# Patient Record
Sex: Male | Born: 1975 | Race: Black or African American | Hispanic: No | Marital: Single | Smoking: Current every day smoker
Health system: Southern US, Community
[De-identification: ages and names within clinical notes are randomized; demographics above are authoritative.]

## PROBLEM LIST (undated history)

## (undated) ENCOUNTER — Ambulatory Visit (HOSPITAL_COMMUNITY): Admission: EM | Payer: Commercial Managed Care - HMO | Source: Home / Self Care

## (undated) DIAGNOSIS — S02609A Fracture of mandible, unspecified, initial encounter for closed fracture: Secondary | ICD-10-CM

## (undated) DIAGNOSIS — I10 Essential (primary) hypertension: Secondary | ICD-10-CM

## (undated) DIAGNOSIS — W3400XA Accidental discharge from unspecified firearms or gun, initial encounter: Secondary | ICD-10-CM

## (undated) HISTORY — PX: BACK SURGERY: SHX140

---

## 1999-02-11 ENCOUNTER — Emergency Department (HOSPITAL_COMMUNITY): Admission: EM | Admit: 1999-02-11 | Discharge: 1999-02-11 | Payer: Self-pay | Admitting: Emergency Medicine

## 2000-05-10 ENCOUNTER — Encounter: Payer: Self-pay | Admitting: Surgery

## 2000-05-10 ENCOUNTER — Inpatient Hospital Stay (HOSPITAL_COMMUNITY): Admission: EM | Admit: 2000-05-10 | Discharge: 2000-05-11 | Payer: Self-pay

## 2000-06-17 ENCOUNTER — Encounter: Payer: Self-pay | Admitting: Emergency Medicine

## 2000-06-17 ENCOUNTER — Emergency Department (HOSPITAL_COMMUNITY): Admission: EM | Admit: 2000-06-17 | Discharge: 2000-06-17 | Payer: Self-pay | Admitting: Emergency Medicine

## 2000-06-19 ENCOUNTER — Encounter (HOSPITAL_COMMUNITY): Admission: RE | Admit: 2000-06-19 | Discharge: 2000-09-17 | Payer: Self-pay | Admitting: Orthopedic Surgery

## 2009-03-03 ENCOUNTER — Emergency Department (HOSPITAL_COMMUNITY): Admission: EM | Admit: 2009-03-03 | Discharge: 2009-03-03 | Payer: Self-pay | Admitting: Family Medicine

## 2009-03-28 ENCOUNTER — Emergency Department (HOSPITAL_COMMUNITY): Admission: EM | Admit: 2009-03-28 | Discharge: 2009-03-28 | Payer: Self-pay | Admitting: Emergency Medicine

## 2009-06-02 ENCOUNTER — Emergency Department (HOSPITAL_COMMUNITY): Admission: EM | Admit: 2009-06-02 | Discharge: 2009-06-02 | Payer: Self-pay | Admitting: Emergency Medicine

## 2009-09-23 ENCOUNTER — Emergency Department (HOSPITAL_COMMUNITY): Admission: EM | Admit: 2009-09-23 | Discharge: 2009-09-23 | Payer: Self-pay | Admitting: Family Medicine

## 2011-03-09 LAB — GC/CHLAMYDIA PROBE AMP, GENITAL
Chlamydia, DNA Probe: NEGATIVE
GC Probe Amp, Genital: NEGATIVE

## 2011-03-09 LAB — POCT URINALYSIS DIP (DEVICE)
Glucose, UA: NEGATIVE mg/dL
Ketones, ur: 40 mg/dL — AB
Specific Gravity, Urine: >=1.03 (ref 1.005–1.030)

## 2011-03-09 LAB — RPR: RPR Ser Ql: NONREACTIVE

## 2011-03-12 LAB — URINALYSIS, ROUTINE W REFLEX MICROSCOPIC
Nitrite: NEGATIVE
Protein, ur: NEGATIVE mg/dL
Urobilinogen, UA: 0.2 mg/dL (ref 0.0–1.0)
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
## 2011-10-03 ENCOUNTER — Inpatient Hospital Stay (INDEPENDENT_AMBULATORY_CARE_PROVIDER_SITE_OTHER)
Admission: RE | Admit: 2011-10-03 | Discharge: 2011-10-03 | Disposition: A | Discharge status: Home / Self Care | Payer: Self-pay | Source: Ambulatory Visit | Attending: Family Medicine | Admitting: Family Medicine

## 2011-10-03 DIAGNOSIS — J40 Bronchitis, not specified as acute or chronic: Secondary | ICD-10-CM

## 2011-10-03 DIAGNOSIS — N342 Other urethritis: Secondary | ICD-10-CM

## 2011-10-04 LAB — GC/CHLAMYDIA PROBE AMP, GENITAL: GC Probe Amp, Genital: NEGATIVE

## 2014-02-10 ENCOUNTER — Encounter (HOSPITAL_COMMUNITY): Payer: Self-pay | Admitting: Emergency Medicine

## 2014-02-10 ENCOUNTER — Emergency Department (HOSPITAL_COMMUNITY)
Admission: EM | Admit: 2014-02-10 | Discharge: 2014-02-10 | Disposition: A | Discharge status: Home / Self Care | Payer: Self-pay | Attending: Emergency Medicine | Admitting: Emergency Medicine

## 2014-02-10 ENCOUNTER — Emergency Department (HOSPITAL_COMMUNITY): Payer: Self-pay

## 2014-02-10 DIAGNOSIS — F17200 Nicotine dependence, unspecified, uncomplicated: Secondary | ICD-10-CM | POA: Insufficient documentation

## 2014-02-10 DIAGNOSIS — R109 Unspecified abdominal pain: Secondary | ICD-10-CM

## 2014-02-10 DIAGNOSIS — Z87828 Personal history of other (healed) physical injury and trauma: Secondary | ICD-10-CM | POA: Insufficient documentation

## 2014-02-10 DIAGNOSIS — R1013 Epigastric pain: Secondary | ICD-10-CM | POA: Insufficient documentation

## 2014-02-10 DIAGNOSIS — R1011 Right upper quadrant pain: Secondary | ICD-10-CM | POA: Insufficient documentation

## 2014-02-10 HISTORY — DX: Accidental discharge from unspecified firearms or gun, initial encounter: W34.00XA

## 2014-02-10 LAB — COMPREHENSIVE METABOLIC PANEL
ALT: 43 U/L (ref 0–53)
AST: 24 U/L (ref 0–37)
Albumin: 4.2 g/dL (ref 3.5–5.2)
Alkaline Phosphatase: 108 U/L (ref 39–117)
BILIRUBIN TOTAL: 0.3 mg/dL (ref 0.3–1.2)
BUN: 10 mg/dL (ref 6–23)
CALCIUM: 9.6 mg/dL (ref 8.4–10.5)
CHLORIDE: 104 meq/L (ref 96–112)
CO2: 28 meq/L (ref 19–32)
CREATININE: 1.22 mg/dL (ref 0.50–1.35)
GFR, EST AFRICAN AMERICAN: 87 mL/min — AB (ref >90)
GFR, EST NON AFRICAN AMERICAN: 75 mL/min — AB (ref >90)
GLUCOSE: 115 mg/dL — AB (ref 70–99)
Potassium: 4.3 mEq/L (ref 3.7–5.3)
SODIUM: 144 meq/L (ref 137–147)
Total Protein: 7.6 g/dL (ref 6.0–8.3)

## 2014-02-10 LAB — CBC WITH DIFFERENTIAL/PLATELET
BASOS ABS: 0 10*3/uL (ref 0.0–0.1)
Basophils Relative: 0 % (ref 0–1)
EOS PCT: 1 % (ref 0–5)
Eosinophils Absolute: 0.1 10*3/uL (ref 0.0–0.7)
HEMATOCRIT: 46.8 % (ref 39.0–52.0)
Hemoglobin: 16 g/dL (ref 13.0–17.0)
LYMPHS ABS: 1.6 10*3/uL (ref 0.7–4.0)
LYMPHS PCT: 17 % (ref 12–46)
MCH: 30.1 pg (ref 26.0–34.0)
MCHC: 34.2 g/dL (ref 30.0–36.0)
MCV: 88 fL (ref 78.0–100.0)
MONO ABS: 0.4 10*3/uL (ref 0.1–1.0)
Monocytes Relative: 5 % (ref 3–12)
NEUTROS ABS: 7.1 10*3/uL (ref 1.7–7.7)
Neutrophils Relative %: 77 % (ref 43–77)
Platelets: 216 10*3/uL (ref 150–400)
RBC: 5.32 MIL/uL (ref 4.22–5.81)
RDW: 13 % (ref 11.5–15.5)
WBC: 9.2 10*3/uL (ref 4.0–10.5)

## 2014-02-10 LAB — URINALYSIS, ROUTINE W REFLEX MICROSCOPIC
Bilirubin Urine: NEGATIVE
GLUCOSE, UA: NEGATIVE mg/dL
HGB URINE DIPSTICK: NEGATIVE
KETONES UR: NEGATIVE mg/dL
Leukocytes, UA: NEGATIVE
Nitrite: NEGATIVE
PROTEIN: NEGATIVE mg/dL
Specific Gravity, Urine: 1.039 — ABNORMAL HIGH (ref 1.005–1.030)
UROBILINOGEN UA: 1 mg/dL (ref 0.0–1.0)
pH: 7 (ref 5.0–8.0)

## 2014-02-10 LAB — LIPASE, BLOOD: Lipase: 27 U/L (ref 11–59)

## 2014-02-10 MED ORDER — SODIUM CHLORIDE 0.9 % IV BOLUS (SEPSIS)
1000.0000 mL | Freq: Once | INTRAVENOUS | Status: AC
  Administered 2014-02-10: 1000 mL via INTRAVENOUS

## 2014-02-10 MED ORDER — OXYCODONE-ACETAMINOPHEN 5-325 MG PO TABS: 2.0000 | ORAL_TABLET | ORAL | Status: DC | PRN

## 2014-02-10 MED ORDER — IOHEXOL 300 MG/ML  SOLN
25.0000 mL | INTRAMUSCULAR | Status: AC
  Administered 2014-02-10: 25 mL via ORAL

## 2014-02-10 MED ORDER — IOHEXOL 300 MG/ML  SOLN: 25.0000 mL | Freq: Once | INTRAMUSCULAR | Status: DC | PRN

## 2014-02-10 MED ORDER — SODIUM CHLORIDE 0.9 % IV SOLN
INTRAVENOUS | Status: DC
  Administered 2014-02-10: 12:00:00 via INTRAVENOUS

## 2014-02-10 MED ORDER — ONDANSETRON HCL 4 MG/2ML IJ SOLN
4.0000 mg | Freq: Once | INTRAMUSCULAR | Status: AC
  Administered 2014-02-10: 4 mg via INTRAVENOUS
  Filled 2014-02-10: qty 2

## 2014-02-10 MED ORDER — IOHEXOL 300 MG/ML  SOLN
100.0000 mL | Freq: Once | INTRAMUSCULAR | Status: AC | PRN
  Administered 2014-02-10: 100 mL via INTRAVENOUS

## 2014-02-10 MED ORDER — HYDROMORPHONE HCL PF 1 MG/ML IJ SOLN
1.0000 mg | Freq: Once | INTRAMUSCULAR | Status: AC
  Administered 2014-02-10: 1 mg via INTRAVENOUS
  Filled 2014-02-10: qty 1

## 2014-02-10 NOTE — Discharge Instructions (Signed)
Abdominal Pain, Adult °Many things can cause abdominal pain. Usually, abdominal pain is not caused by a disease and will improve without treatment. It can often be observed and treated at home. Your health care provider will do a physical exam and possibly order blood tests and X-rays to help determine the seriousness of your pain. However, in many cases, more time must pass before a clear cause of the pain can be found. Before that point, your health care provider may not know if you need more testing or further treatment. °HOME CARE INSTRUCTIONS  °Monitor your abdominal pain for any changes. The following actions may help to alleviate any discomfort you are experiencing: °· Only take over-the-counter or prescription medicines as directed by your health care provider. °· Do not take laxatives unless directed to do so by your health care provider. °· Try a clear liquid diet (broth, tea, or water) as directed by your health care provider. Slowly move to a bland diet as tolerated. °SEEK MEDICAL CARE IF: °· You have unexplained abdominal pain. °· You have abdominal pain associated with nausea or diarrhea. °· You have pain when you urinate or have a bowel movement. °· You experience abdominal pain that wakes you in the night. °· You have abdominal pain that is worsened or improved by eating food. °· You have abdominal pain that is worsened with eating fatty foods. °SEEK IMMEDIATE MEDICAL CARE IF:  °· Your pain does not go away within 2 hours. °· You have a fever. °· You keep throwing up (vomiting). °· Your pain is felt only in portions of the abdomen, such as the right side or the left lower portion of the abdomen. °· You pass bloody or black tarry stools. °MAKE SURE YOU: °· Understand these instructions.   °· Will watch your condition.   °· Will get help right away if you are not doing well or get worse.   °Document Released: 08/27/2005 Document Revised: 09/07/2013 Document Reviewed: 07/27/2013 °ExitCare® Patient  Information ©2014 [employer]. ° °

## 2014-02-10 NOTE — ED Notes (Signed)
Patient transported to CT 

## 2014-02-10 NOTE — ED Notes (Signed)
Pt states he began to have upper abd pain radiating into his back that woke him from his sleep this morning. C/o nausea also. He is A&Ox4, resp e/u

## 2014-02-10 NOTE — Discharge Planning (Signed)
P4[facility] Felicia E, [employer]iaison  Spoke to patient about primary care resources and establishing care with a provider, resource guide given. My contact information was also provided for future questions or concerns.
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
## 2014-12-17 ENCOUNTER — Emergency Department (HOSPITAL_COMMUNITY)
Admission: EM | Admit: 2014-12-17 | Discharge: 2014-12-17 | Disposition: A | Discharge status: Home / Self Care | Payer: Self-pay | Attending: Emergency Medicine | Admitting: Emergency Medicine

## 2014-12-17 ENCOUNTER — Emergency Department (HOSPITAL_COMMUNITY): Payer: Self-pay

## 2014-12-17 ENCOUNTER — Encounter (HOSPITAL_COMMUNITY): Payer: Self-pay | Admitting: Radiology

## 2014-12-17 DIAGNOSIS — T1490XA Injury, unspecified, initial encounter: Secondary | ICD-10-CM

## 2014-12-17 DIAGNOSIS — R0602 Shortness of breath: Secondary | ICD-10-CM | POA: Insufficient documentation

## 2014-12-17 DIAGNOSIS — Y998 Other external cause status: Secondary | ICD-10-CM | POA: Insufficient documentation

## 2014-12-17 DIAGNOSIS — S20452A Superficial foreign body of left back wall of thorax, initial encounter: Secondary | ICD-10-CM | POA: Insufficient documentation

## 2014-12-17 DIAGNOSIS — R61 Generalized hyperhidrosis: Secondary | ICD-10-CM | POA: Insufficient documentation

## 2014-12-17 DIAGNOSIS — Y9389 Activity, other specified: Secondary | ICD-10-CM | POA: Insufficient documentation

## 2014-12-17 DIAGNOSIS — S4992XA Unspecified injury of left shoulder and upper arm, initial encounter: Secondary | ICD-10-CM | POA: Insufficient documentation

## 2014-12-17 DIAGNOSIS — W3400XA Accidental discharge from unspecified firearms or gun, initial encounter: Secondary | ICD-10-CM | POA: Insufficient documentation

## 2014-12-17 DIAGNOSIS — Y9289 Other specified places as the place of occurrence of the external cause: Secondary | ICD-10-CM | POA: Insufficient documentation

## 2014-12-17 LAB — TYPE AND SCREEN
ABO/RH(D): O POS
Antibody Screen: NEGATIVE
UNIT DIVISION: 0
Unit division: 0

## 2014-12-17 LAB — COMPREHENSIVE METABOLIC PANEL
ALT: 30 U/L (ref 0–53)
AST: 27 U/L (ref 0–37)
Albumin: 4.2 g/dL (ref 3.5–5.2)
Alkaline Phosphatase: 101 U/L (ref 39–117)
Anion gap: 15 (ref 5–15)
BUN: 12 mg/dL (ref 6–23)
CHLORIDE: 101 meq/L (ref 96–112)
CO2: 19 mmol/L (ref 19–32)
CREATININE: 1.22 mg/dL (ref 0.50–1.35)
Calcium: 9.4 mg/dL (ref 8.4–10.5)
GFR calc Af Amer: 86 mL/min — ABNORMAL LOW (ref >90)
GFR calc non Af Amer: 74 mL/min — ABNORMAL LOW (ref >90)
Glucose, Bld: 117 mg/dL — ABNORMAL HIGH (ref 70–99)
Potassium: 3.6 mmol/L (ref 3.5–5.1)
Sodium: 135 mmol/L (ref 135–145)
Total Bilirubin: 0.4 mg/dL (ref 0.3–1.2)
Total Protein: 7.4 g/dL (ref 6.0–8.3)

## 2014-12-17 LAB — CBC
HCT: 46.3 % (ref 39.0–52.0)
Hemoglobin: 16.1 g/dL (ref 13.0–17.0)
MCH: 30 pg (ref 26.0–34.0)
MCHC: 34.8 g/dL (ref 30.0–36.0)
MCV: 86.4 fL (ref 78.0–100.0)
PLATELETS: 265 10*3/uL (ref 150–400)
RBC: 5.36 MIL/uL (ref 4.22–5.81)
RDW: 12.8 % (ref 11.5–15.5)
WBC: 8.9 10*3/uL (ref 4.0–10.5)

## 2014-12-17 LAB — PREPARE FRESH FROZEN PLASMA
UNIT DIVISION: 0
Unit division: 0

## 2014-12-17 LAB — ETHANOL: Alcohol, Ethyl (B): 21 mg/dL — ABNORMAL HIGH (ref 0–9)

## 2014-12-17 LAB — ABO/RH: ABO/RH(D): O POS

## 2014-12-17 LAB — PROTIME-INR
INR: 0.94 (ref 0.00–1.49)
Prothrombin Time: 12.6 seconds (ref 11.6–15.2)

## 2014-12-17 MED ORDER — HYDROMORPHONE HCL 1 MG/ML IJ SOLN
1.0000 mg | Freq: Once | INTRAMUSCULAR | Status: AC
  Administered 2014-12-17: 1 mg via INTRAVENOUS

## 2014-12-17 MED ORDER — LIDOCAINE-EPINEPHRINE (PF) 2 %-1:200000 IJ SOLN
10.0000 mL | Freq: Once | INTRAMUSCULAR | Status: AC
  Administered 2014-12-17: 10 mL via INTRADERMAL
  Filled 2014-12-17: qty 20

## 2014-12-17 MED ORDER — TETANUS-DIPHTH-ACELL PERTUSSIS 5-2.5-18.5 LF-MCG/0.5 IM SUSP
0.5000 mL | Freq: Once | INTRAMUSCULAR | Status: AC
  Administered 2014-12-17: 0.5 mL via INTRAMUSCULAR
  Filled 2014-12-17: qty 0.5

## 2014-12-17 MED ORDER — HYDROMORPHONE HCL 1 MG/ML IJ SOLN
INTRAMUSCULAR | Status: AC
  Filled 2014-12-17: qty 1

## 2014-12-17 MED ORDER — HYDROCODONE-ACETAMINOPHEN 5-325 MG PO TABS: 2.0000 | ORAL_TABLET | ORAL | Status: DC | PRN

## 2014-12-17 MED ORDER — CEFAZOLIN SODIUM-DEXTROSE 2-3 GM-% IV SOLR
2.0000 g | Freq: Once | INTRAVENOUS | Status: AC
  Administered 2014-12-17: 2 g via INTRAVENOUS
  Filled 2014-12-17: qty 50

## 2014-12-17 MED ORDER — IOHEXOL 300 MG/ML  SOLN
80.0000 mL | Freq: Once | INTRAMUSCULAR | Status: AC | PRN
  Administered 2014-12-17: 80 mL via INTRAVENOUS

## 2014-12-17 NOTE — ED Notes (Signed)
Patient to CT with RN
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
## 2014-12-17 NOTE — ED Notes (Signed)
Patient returned from CT
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
## 2014-12-17 NOTE — ED Notes (Signed)
C-Collar removed by MD
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
## 2014-12-18 LAB — CDS SEROLOGY

## 2014-12-27 ENCOUNTER — Emergency Department (HOSPITAL_COMMUNITY)
Admission: EM | Admit: 2014-12-27 | Discharge: 2014-12-27 | Disposition: A | Discharge status: Home / Self Care | Payer: Self-pay | Attending: Emergency Medicine | Admitting: Emergency Medicine

## 2014-12-27 ENCOUNTER — Encounter (HOSPITAL_COMMUNITY): Payer: Self-pay | Admitting: *Deleted

## 2014-12-27 DIAGNOSIS — T798XXA Other early complications of trauma, initial encounter: Secondary | ICD-10-CM

## 2014-12-27 DIAGNOSIS — Z720 Tobacco use: Secondary | ICD-10-CM | POA: Insufficient documentation

## 2014-12-27 DIAGNOSIS — Z4802 Encounter for removal of sutures: Secondary | ICD-10-CM

## 2014-12-27 DIAGNOSIS — L089 Local infection of the skin and subcutaneous tissue, unspecified: Secondary | ICD-10-CM | POA: Insufficient documentation

## 2014-12-27 MED ORDER — CEPHALEXIN 500 MG PO CAPS: 500.0000 mg | ORAL_CAPSULE | Freq: Four times a day (QID) | ORAL | Status: DC

## 2014-12-27 MED ORDER — HYDROCODONE-ACETAMINOPHEN 5-325 MG PO TABS: 1.0000 | ORAL_TABLET | Freq: Four times a day (QID) | ORAL | Status: DC | PRN
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
## 2015-01-17 ENCOUNTER — Encounter (HOSPITAL_COMMUNITY): Payer: Self-pay | Admitting: Emergency Medicine

## 2016-04-20 ENCOUNTER — Encounter (HOSPITAL_COMMUNITY): Payer: Self-pay | Admitting: Emergency Medicine

## 2016-04-20 ENCOUNTER — Emergency Department (HOSPITAL_COMMUNITY)
Admission: EM | Admit: 2016-04-20 | Discharge: 2016-04-20 | Disposition: A | Discharge status: Home / Self Care | Payer: Self-pay | Attending: Emergency Medicine | Admitting: Emergency Medicine

## 2016-04-20 DIAGNOSIS — Z792 Long term (current) use of antibiotics: Secondary | ICD-10-CM | POA: Insufficient documentation

## 2016-04-20 DIAGNOSIS — F17210 Nicotine dependence, cigarettes, uncomplicated: Secondary | ICD-10-CM | POA: Insufficient documentation

## 2016-04-20 DIAGNOSIS — Z87828 Personal history of other (healed) physical injury and trauma: Secondary | ICD-10-CM | POA: Insufficient documentation

## 2016-04-20 DIAGNOSIS — J029 Acute pharyngitis, unspecified: Secondary | ICD-10-CM | POA: Insufficient documentation

## 2016-04-20 LAB — RAPID STREP SCREEN (MED CTR MEBANE ONLY): STREPTOCOCCUS, GROUP A SCREEN (DIRECT): NEGATIVE
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
## 2016-04-20 NOTE — Discharge Instructions (Signed)
Read the information below.    We will send the throat swab for culture, if positive we will call you.  Be sure to establish a primary care provider. [facility].  You can try symptomatic relief with tylenol or ibuprofen for pain, gargle with warm salt water, consumer warm liquids such as chicken broth or tea, and try throat lozenges You may return to the Emergency Department at any time for worsening condition or any new symptoms that concern you. Return to the ED if you develop fever or difficulty swallowing, difficulty breathing, drooling, or unilateral swelling.   Results for orders placed or performed during the hospital encounter of 04/20/16  Rapid strep screen  Result Value Ref Range   Streptococcus, Group A Screen (Direct) NEGATIVE NEGATIVE   No results found.    Pharyngitis Pharyngitis is a sore throat (pharynx). There is redness, pain, and swelling of your throat. HOME CARE   Drink enough fluids to keep your pee (urine) clear or pale yellow.  Only take medicine as told by your doctor.  You may get sick again if you do not take medicine as told. Finish your medicines, even if you start to feel better.  Do not take aspirin.  Rest.  Rinse your mouth (gargle) with salt water ( tsp of salt per 1 qt of water) every 1-2 hours. This will help the pain.  If you are not at risk for choking, you can suck on hard candy or sore throat lozenges. GET HELP IF:  You have large, tender lumps on your neck.  You have a rash.  You cough up green, yellow-brown, or bloody spit. GET HELP RIGHT AWAY IF:   You have a stiff neck.  You drool or cannot swallow liquids.  You throw up (vomit) or are not able to keep medicine or liquids down.  You have very bad pain that does not go away with medicine.  You have problems breathing (not from a stuffy nose). MAKE SURE YOU:   Understand these instructions.  Will watch your  condition.  Will get help right away if you are not doing well or get worse.   This information is not intended to replace advice given to you by your health care provider. Make sure you discuss any questions you have with your health care provider.   Document Released: 05/05/2008 Document Revised: 09/07/2013 Document Reviewed: 07/25/2013 Elsevier Interactive Patient Education [employer]2016 [employer]vier Inc.

## 2016-04-20 NOTE — ED Notes (Signed)
Patient c/o sore throat beginning this morning. Reports painful to swallow.

## 2016-04-23 LAB — CULTURE, GROUP A STREP (THRC)

## 2016-05-08 ENCOUNTER — Ambulatory Visit: Payer: Self-pay | Admitting: Family Medicine

## 2016-12-16 ENCOUNTER — Encounter (HOSPITAL_COMMUNITY): Payer: Self-pay

## 2016-12-16 ENCOUNTER — Emergency Department (HOSPITAL_COMMUNITY)
Admission: EM | Admit: 2016-12-16 | Discharge: 2016-12-16 | Disposition: A | Discharge status: Home / Self Care | Payer: Self-pay | Attending: Emergency Medicine | Admitting: Emergency Medicine

## 2016-12-16 ENCOUNTER — Emergency Department (HOSPITAL_COMMUNITY): Payer: Self-pay

## 2016-12-16 DIAGNOSIS — Z5321 Procedure and treatment not carried out due to patient leaving prior to being seen by health care provider: Secondary | ICD-10-CM | POA: Insufficient documentation

## 2016-12-16 DIAGNOSIS — Z4801 Encounter for change or removal of surgical wound dressing: Secondary | ICD-10-CM | POA: Insufficient documentation

## 2016-12-16 DIAGNOSIS — R200 Anesthesia of skin: Secondary | ICD-10-CM | POA: Insufficient documentation
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
## 2016-12-16 NOTE — ED Notes (Signed)
Per staff, Pt decided to leave.

## 2017-01-23 ENCOUNTER — Encounter (HOSPITAL_COMMUNITY): Payer: Self-pay | Admitting: Emergency Medicine

## 2017-01-23 ENCOUNTER — Ambulatory Visit (HOSPITAL_COMMUNITY)
Admission: EM | Admit: 2017-01-23 | Discharge: 2017-01-23 | Disposition: A | Discharge status: Home / Self Care | Payer: Self-pay | Attending: Family Medicine | Admitting: Family Medicine

## 2017-01-23 DIAGNOSIS — B309 Viral conjunctivitis, unspecified: Secondary | ICD-10-CM

## 2017-01-23 MED ORDER — POLYMYXIN B-TRIMETHOPRIM 10000-0.1 UNIT/ML-% OP SOLN: 1.0000 [drp] | OPHTHALMIC | 0 refills | Status: DC

## 2017-01-23 NOTE — ED Triage Notes (Signed)
Pt started having irritation in his right eye yesterday.  This morning he woke up with swelling around the eye.  Pt's girlfriend was diagnosed with pink eye today.
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
## 2017-01-23 NOTE — Discharge Instructions (Signed)
Warm cloth before eye medicine, return if needed.

## 2017-04-16 ENCOUNTER — Emergency Department (HOSPITAL_COMMUNITY): Payer: Self-pay

## 2017-04-16 ENCOUNTER — Emergency Department (HOSPITAL_COMMUNITY)
Admission: EM | Admit: 2017-04-16 | Discharge: 2017-04-16 | Disposition: A | Discharge status: Home / Self Care | Payer: Self-pay | Attending: Emergency Medicine | Admitting: Emergency Medicine

## 2017-04-16 ENCOUNTER — Encounter (HOSPITAL_COMMUNITY): Payer: Self-pay | Admitting: Emergency Medicine

## 2017-04-16 DIAGNOSIS — R112 Nausea with vomiting, unspecified: Secondary | ICD-10-CM | POA: Insufficient documentation

## 2017-04-16 DIAGNOSIS — Z79899 Other long term (current) drug therapy: Secondary | ICD-10-CM | POA: Insufficient documentation

## 2017-04-16 DIAGNOSIS — F17210 Nicotine dependence, cigarettes, uncomplicated: Secondary | ICD-10-CM | POA: Insufficient documentation

## 2017-04-16 DIAGNOSIS — R109 Unspecified abdominal pain: Secondary | ICD-10-CM | POA: Insufficient documentation

## 2017-04-16 DIAGNOSIS — R197 Diarrhea, unspecified: Secondary | ICD-10-CM | POA: Insufficient documentation

## 2017-04-16 LAB — URINALYSIS, ROUTINE W REFLEX MICROSCOPIC
BILIRUBIN URINE: NEGATIVE
Bacteria, UA: NONE SEEN
GLUCOSE, UA: NEGATIVE mg/dL
HGB URINE DIPSTICK: NEGATIVE
Ketones, ur: NEGATIVE mg/dL
Leukocytes, UA: NEGATIVE
NITRITE: NEGATIVE
PH: 5 (ref 5.0–8.0)
Protein, ur: 30 mg/dL — AB
Specific Gravity, Urine: 1.026 (ref 1.005–1.030)

## 2017-04-16 LAB — COMPREHENSIVE METABOLIC PANEL
ALT: 43 U/L (ref 17–63)
AST: 48 U/L — ABNORMAL HIGH (ref 15–41)
Albumin: 4.6 g/dL (ref 3.5–5.0)
Alkaline Phosphatase: 83 U/L (ref 38–126)
Anion gap: 10 (ref 5–15)
BILIRUBIN TOTAL: 0.6 mg/dL (ref 0.3–1.2)
BUN: 15 mg/dL (ref 6–20)
CALCIUM: 9.2 mg/dL (ref 8.9–10.3)
CHLORIDE: 106 mmol/L (ref 101–111)
CO2: 22 mmol/L (ref 22–32)
CREATININE: 1.1 mg/dL (ref 0.61–1.24)
Glucose, Bld: 135 mg/dL — ABNORMAL HIGH (ref 65–99)
Potassium: 3.9 mmol/L (ref 3.5–5.1)
Sodium: 138 mmol/L (ref 135–145)
TOTAL PROTEIN: 8.2 g/dL — AB (ref 6.5–8.1)

## 2017-04-16 LAB — CBC
HCT: 48 % (ref 39.0–52.0)
Hemoglobin: 16.7 g/dL (ref 13.0–17.0)
MCH: 31.2 pg (ref 26.0–34.0)
MCHC: 34.8 g/dL (ref 30.0–36.0)
MCV: 89.6 fL (ref 78.0–100.0)
PLATELETS: 232 10*3/uL (ref 150–400)
RBC: 5.36 MIL/uL (ref 4.22–5.81)
RDW: 12 % (ref 11.5–15.5)
WBC: 15.4 10*3/uL — AB (ref 4.0–10.5)

## 2017-04-16 LAB — POC OCCULT BLOOD, ED: FECAL OCCULT BLD: POSITIVE — AB

## 2017-04-16 LAB — LIPASE, BLOOD: LIPASE: 23 U/L (ref 11–51)

## 2017-04-16 MED ORDER — IOPAMIDOL (ISOVUE-300) INJECTION 61%
100.0000 mL | Freq: Once | INTRAVENOUS | Status: AC | PRN
  Administered 2017-04-16: 100 mL via INTRAVENOUS

## 2017-04-16 MED ORDER — DICYCLOMINE HCL 10 MG PO CAPS
10.0000 mg | ORAL_CAPSULE | Freq: Once | ORAL | Status: AC
  Administered 2017-04-16: 10 mg via ORAL
  Filled 2017-04-16: qty 1

## 2017-04-16 MED ORDER — HALOPERIDOL LACTATE 5 MG/ML IJ SOLN
2.0000 mg | Freq: Once | INTRAMUSCULAR | Status: AC
  Administered 2017-04-16: 2 mg via INTRAVENOUS
  Filled 2017-04-16: qty 1

## 2017-04-16 MED ORDER — PROMETHAZINE HCL 25 MG PO TABS: 25.0000 mg | ORAL_TABLET | Freq: Four times a day (QID) | ORAL | 0 refills | Status: DC | PRN

## 2017-04-16 MED ORDER — MORPHINE SULFATE (PF) 4 MG/ML IV SOLN
4.0000 mg | Freq: Once | INTRAVENOUS | Status: AC
  Administered 2017-04-16: 4 mg via INTRAVENOUS
  Filled 2017-04-16: qty 1

## 2017-04-16 MED ORDER — DICYCLOMINE HCL 20 MG PO TABS: 20.0000 mg | ORAL_TABLET | Freq: Two times a day (BID) | ORAL | 0 refills | Status: DC

## 2017-04-16 MED ORDER — IOPAMIDOL (ISOVUE-300) INJECTION 61%
INTRAVENOUS | Status: AC
  Filled 2017-04-16: qty 100

## 2017-04-16 MED ORDER — PROMETHAZINE HCL 25 MG/ML IJ SOLN
25.0000 mg | Freq: Once | INTRAMUSCULAR | Status: AC
  Administered 2017-04-16: 25 mg via INTRAVENOUS
  Filled 2017-04-16: qty 1

## 2017-04-16 MED ORDER — SODIUM CHLORIDE 0.9 % IV BOLUS (SEPSIS)
1000.0000 mL | Freq: Once | INTRAVENOUS | Status: AC
  Administered 2017-04-16: 1000 mL via INTRAVENOUS

## 2017-04-16 MED ORDER — METOCLOPRAMIDE HCL 5 MG/ML IJ SOLN
10.0000 mg | Freq: Once | INTRAMUSCULAR | Status: AC
  Administered 2017-04-16: 10 mg via INTRAVENOUS
  Filled 2017-04-16: qty 2

## 2017-04-16 MED ORDER — HYDROCHLOROTHIAZIDE 12.5 MG PO TABS: 25.0000 mg | ORAL_TABLET | Freq: Every day | ORAL | 0 refills | Status: DC
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
## 2017-04-16 NOTE — ED Notes (Signed)
Patient is A & O x4.  He understood AVS instructions.  

## 2017-04-16 NOTE — Discharge Instructions (Signed)
Please stay hydrated by drinking plenty of fluid.  Take Phenergan as needed for nausea.  Take HCTZ for your blood pressure.  Find a primary care provider for further management of your condition.  Avoid marijuana use as it may cause your symptoms.  Try hot shower which may help with your discomfort. Return to the ER if you have any concerns.

## 2017-04-16 NOTE — ED Triage Notes (Signed)
Pt states he has abd pain and chest pain with nausea, vomiting, and diarrhea that started about 5-6 hours ago  Pt states he has chills and cannot stop shaking

## 2017-10-30 ENCOUNTER — Emergency Department (HOSPITAL_COMMUNITY): Payer: Self-pay

## 2017-10-30 ENCOUNTER — Encounter (HOSPITAL_COMMUNITY): Payer: Self-pay | Admitting: Emergency Medicine

## 2017-10-30 ENCOUNTER — Emergency Department (HOSPITAL_COMMUNITY)
Admission: EM | Admit: 2017-10-30 | Discharge: 2017-10-30 | Disposition: A | Discharge status: Home / Self Care | Payer: Self-pay | Attending: Emergency Medicine | Admitting: Emergency Medicine

## 2017-10-30 DIAGNOSIS — Z5321 Procedure and treatment not carried out due to patient leaving prior to being seen by health care provider: Secondary | ICD-10-CM | POA: Insufficient documentation

## 2017-10-30 DIAGNOSIS — R200 Anesthesia of skin: Secondary | ICD-10-CM | POA: Insufficient documentation

## 2017-10-30 LAB — CBC
HCT: 45.1 % (ref 39.0–52.0)
Hemoglobin: 15.4 g/dL (ref 13.0–17.0)
MCH: 31.2 pg (ref 26.0–34.0)
MCHC: 34.1 g/dL (ref 30.0–36.0)
MCV: 91.3 fL (ref 78.0–100.0)
PLATELETS: 303 10*3/uL (ref 150–400)
RBC: 4.94 MIL/uL (ref 4.22–5.81)
RDW: 12.1 % (ref 11.5–15.5)
WBC: 11.6 10*3/uL — ABNORMAL HIGH (ref 4.0–10.5)

## 2017-10-30 LAB — BASIC METABOLIC PANEL
Anion gap: 6 (ref 5–15)
BUN: 13 mg/dL (ref 6–20)
CALCIUM: 9.6 mg/dL (ref 8.9–10.3)
CO2: 27 mmol/L (ref 22–32)
CREATININE: 1.2 mg/dL (ref 0.61–1.24)
Chloride: 103 mmol/L (ref 101–111)
GFR calc non Af Amer: >60 mL/min (ref >60)
GLUCOSE: 98 mg/dL (ref 65–99)
Potassium: 4.1 mmol/L (ref 3.5–5.1)
Sodium: 136 mmol/L (ref 135–145)

## 2017-10-30 LAB — I-STAT TROPONIN, ED: TROPONIN I, POC: 0.01 ng/mL (ref 0.00–0.08)

## 2017-10-30 NOTE — ED Triage Notes (Addendum)
Reports left arm weakness that started a little after 6pm.  No deficits noted.  Denies having any headache or visual issues.  Reporting some chest pain on left side.  Describes as pressure and burning.

## 2017-10-30 NOTE — ED Notes (Signed)
Dr. Jacqulyn BathLong notified of symptoms.  No other orders received at this time.

## 2017-10-30 NOTE — ED Notes (Signed)
Pt states he is not staying to be seen due to wait.  Encouraged pt to stay and he declines.

## 2017-10-30 NOTE — ED Notes (Signed)
Pt's mother upset that pt is leaving.  She is trying to talk him into staying.

## 2017-10-30 NOTE — ED Notes (Addendum)
Pt arrived to triage requesting BP to be taken.  Pt reports L arm numbness that started "about 2 hours ago."  Asked pt what time symptoms started and he continues to say about 2 hours.  Denies injury or pain.  No arm drift noted.  Normal sensation to both arms.  No other neuro deficits noted on triage exam.  Pt taken to triage.

## 2017-10-30 NOTE — ED Notes (Signed)
Pt remains in waiting room. Updated on wait for treatment room. 

## 2017-10-30 NOTE — ED Notes (Signed)
Pt told his mother that he feels better and he will return if he starts to feel bad again.  Explained that pt should stay and they declined.

## 2018-05-11 ENCOUNTER — Other Ambulatory Visit: Payer: Self-pay

## 2018-05-11 ENCOUNTER — Emergency Department (HOSPITAL_COMMUNITY): Admission: EM | Admit: 2018-05-11 | Payer: Self-pay | Source: Home / Self Care

## 2018-05-12 ENCOUNTER — Ambulatory Visit (INDEPENDENT_AMBULATORY_CARE_PROVIDER_SITE_OTHER): Payer: Self-pay

## 2018-05-12 ENCOUNTER — Encounter (HOSPITAL_COMMUNITY): Payer: Self-pay | Admitting: Emergency Medicine

## 2018-05-12 ENCOUNTER — Ambulatory Visit (HOSPITAL_COMMUNITY)
Admission: EM | Admit: 2018-05-12 | Discharge: 2018-05-12 | Disposition: A | Discharge status: Home / Self Care | Payer: Self-pay | Attending: Family Medicine | Admitting: Family Medicine

## 2018-05-12 ENCOUNTER — Other Ambulatory Visit: Payer: Self-pay

## 2018-05-12 DIAGNOSIS — S62326A Displaced fracture of shaft of fifth metacarpal bone, right hand, initial encounter for closed fracture: Secondary | ICD-10-CM

## 2018-05-12 DIAGNOSIS — S62339A Displaced fracture of neck of unspecified metacarpal bone, initial encounter for closed fracture: Secondary | ICD-10-CM

## 2018-05-12 DIAGNOSIS — W228XXA Striking against or struck by other objects, initial encounter: Secondary | ICD-10-CM

## 2018-05-12 MED ORDER — HYDROCODONE-ACETAMINOPHEN 5-325 MG PO TABS: 1.0000 | ORAL_TABLET | Freq: Four times a day (QID) | ORAL | 0 refills | Status: AC | PRN

## 2018-05-12 MED ORDER — IBUPROFEN 800 MG PO TABS: 800.0000 mg | ORAL_TABLET | Freq: Three times a day (TID) | ORAL | 0 refills | Status: DC

## 2018-05-12 NOTE — ED Notes (Signed)
Ortho Tech paged.

## 2018-05-12 NOTE — Progress Notes (Signed)
Orthopedic Tech Progress Note Patient Details:  Raymond Ford May 09, 1976 [id number]  Ortho Devices Type of Ortho Device: Ace wrap, Ulna gutter splint Ortho Device/Splint Location: rue Ortho Device/Splint Interventions: Application   Post Interventions Patient Tolerated: Well Instructions Provided: Care of device   Raymond Ford, Raymond Ford 05/12/2018, 1:35 PM

## 2018-05-12 NOTE — Discharge Instructions (Signed)
Use anti-inflammatories for pain/swelling. You may take up to 800 mg Ibuprofen every 8 hours with food. You may supplement Ibuprofen with Tylenol [phone] mg every 8 hours.   You may use hydrocodone for severe pain or pain at bedtime.  Please do not take during the day or when you will be driving.  Please follow-up with orthopedics/hand for further evaluation and treatment.

## 2018-05-12 NOTE — ED Notes (Signed)
Ortho Tech returned call.  He is with another pt and will be here ASAP.
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
## 2018-05-12 NOTE — ED Triage Notes (Signed)
Patient hit wall 2 days ago.  Right hand painful and swollen.  Minimal movement of fingers. capillary refill 2 +

## 2019-03-25 ENCOUNTER — Observation Stay (HOSPITAL_COMMUNITY)
Admission: EM | Admit: 2019-03-25 | Discharge: 2019-03-26 | Disposition: A | Discharge status: Home / Self Care | Payer: Self-pay | Attending: Otolaryngology | Admitting: Otolaryngology

## 2019-03-25 DIAGNOSIS — Z23 Encounter for immunization: Secondary | ICD-10-CM | POA: Insufficient documentation

## 2019-03-25 DIAGNOSIS — I10 Essential (primary) hypertension: Secondary | ICD-10-CM | POA: Insufficient documentation

## 2019-03-25 DIAGNOSIS — F10129 Alcohol abuse with intoxication, unspecified: Secondary | ICD-10-CM | POA: Insufficient documentation

## 2019-03-25 DIAGNOSIS — W19XXXA Unspecified fall, initial encounter: Secondary | ICD-10-CM | POA: Insufficient documentation

## 2019-03-25 DIAGNOSIS — M264 Malocclusion, unspecified: Secondary | ICD-10-CM | POA: Insufficient documentation

## 2019-03-25 DIAGNOSIS — S02609A Fracture of mandible, unspecified, initial encounter for closed fracture: Secondary | ICD-10-CM | POA: Diagnosis present

## 2019-03-25 DIAGNOSIS — Z791 Long term (current) use of non-steroidal anti-inflammatories (NSAID): Secondary | ICD-10-CM | POA: Insufficient documentation

## 2019-03-25 DIAGNOSIS — S0266XB Fracture of symphysis of mandible, initial encounter for open fracture: Principal | ICD-10-CM

## 2019-03-25 DIAGNOSIS — F17210 Nicotine dependence, cigarettes, uncomplicated: Secondary | ICD-10-CM | POA: Insufficient documentation

## 2019-03-25 HISTORY — DX: Essential (primary) hypertension: I10

## 2019-03-26 ENCOUNTER — Observation Stay (HOSPITAL_COMMUNITY): Payer: Self-pay | Admitting: Certified Registered"

## 2019-03-26 ENCOUNTER — Other Ambulatory Visit: Payer: Self-pay

## 2019-03-26 ENCOUNTER — Emergency Department (HOSPITAL_COMMUNITY): Payer: Self-pay

## 2019-03-26 ENCOUNTER — Encounter (HOSPITAL_COMMUNITY)
Admission: EM | Disposition: A | Discharge status: Home / Self Care | Payer: Self-pay | Source: Home / Self Care | Attending: Emergency Medicine

## 2019-03-26 ENCOUNTER — Encounter (HOSPITAL_COMMUNITY): Payer: Self-pay

## 2019-03-26 DIAGNOSIS — S02609A Fracture of mandible, unspecified, initial encounter for closed fracture: Secondary | ICD-10-CM | POA: Diagnosis present

## 2019-03-26 DIAGNOSIS — S0266XB Fracture of symphysis of mandible, initial encounter for open fracture: Secondary | ICD-10-CM

## 2019-03-26 HISTORY — DX: Fracture of symphysis of mandible, initial encounter for open fracture: S02.66XB

## 2019-03-26 HISTORY — DX: Fracture of mandible, unspecified, initial encounter for closed fracture: S02.609A

## 2019-03-26 HISTORY — PX: ORIF MANDIBULAR FRACTURE: SHX2127

## 2019-03-26 LAB — COMPREHENSIVE METABOLIC PANEL
ALT: 20 U/L (ref 0–44)
AST: 23 U/L (ref 15–41)
Albumin: 3.6 g/dL (ref 3.5–5.0)
Alkaline Phosphatase: 83 U/L (ref 38–126)
Anion gap: 16 — ABNORMAL HIGH (ref 5–15)
BUN: 11 mg/dL (ref 6–20)
CO2: 20 mmol/L — ABNORMAL LOW (ref 22–32)
Calcium: 8.9 mg/dL (ref 8.9–10.3)
Chloride: 99 mmol/L (ref 98–111)
Creatinine, Ser: 1.26 mg/dL — ABNORMAL HIGH (ref 0.61–1.24)
GFR calc Af Amer: >60 mL/min (ref >60)
GFR calc non Af Amer: >60 mL/min (ref >60)
Glucose, Bld: 99 mg/dL (ref 70–99)
Potassium: 4.4 mmol/L (ref 3.5–5.1)
Sodium: 135 mmol/L (ref 135–145)
Total Bilirubin: 0.7 mg/dL (ref 0.3–1.2)
Total Protein: 7 g/dL (ref 6.5–8.1)

## 2019-03-26 LAB — CBC WITH DIFFERENTIAL/PLATELET
Abs Immature Granulocytes: 0.06 10*3/uL (ref 0.00–0.07)
Basophils Absolute: 0 10*3/uL (ref 0.0–0.1)
Basophils Relative: 0 %
Eosinophils Absolute: 0.3 10*3/uL (ref 0.0–0.5)
Eosinophils Relative: 2 %
HCT: 43.7 % (ref 39.0–52.0)
Hemoglobin: 14.6 g/dL (ref 13.0–17.0)
Immature Granulocytes: 0 %
Lymphocytes Relative: 8 %
Lymphs Abs: 1 10*3/uL (ref 0.7–4.0)
MCH: 30.9 pg (ref 26.0–34.0)
MCHC: 33.4 g/dL (ref 30.0–36.0)
MCV: 92.6 fL (ref 80.0–100.0)
Monocytes Absolute: 0.6 10*3/uL (ref 0.1–1.0)
Monocytes Relative: 4 %
Neutro Abs: 11.9 10*3/uL — ABNORMAL HIGH (ref 1.7–7.7)
Neutrophils Relative %: 86 %
Platelets: 264 10*3/uL (ref 150–400)
RBC: 4.72 MIL/uL (ref 4.22–5.81)
RDW: 11.7 % (ref 11.5–15.5)
WBC: 13.8 10*3/uL — ABNORMAL HIGH (ref 4.0–10.5)
nRBC: 0 % (ref 0.0–0.2)

## 2019-03-26 LAB — RAPID URINE DRUG SCREEN, HOSP PERFORMED
Amphetamines: NOT DETECTED
Barbiturates: NOT DETECTED
Benzodiazepines: NOT DETECTED
Cocaine: POSITIVE — AB
Opiates: NOT DETECTED
Tetrahydrocannabinol: POSITIVE — AB

## 2019-03-26 LAB — TROPONIN I: Troponin I: <0.03 ng/mL (ref <0.03)

## 2019-03-26 LAB — ETHANOL: Alcohol, Ethyl (B): 11 mg/dL — ABNORMAL HIGH (ref <10)

## 2019-03-26 LAB — SURGICAL PCR SCREEN
MRSA, PCR: NEGATIVE
Staphylococcus aureus: POSITIVE — AB

## 2019-03-26 LAB — HIV ANTIBODY (ROUTINE TESTING W REFLEX): HIV Screen 4th Generation wRfx: NONREACTIVE

## 2019-03-26 SURGERY — OPEN REDUCTION INTERNAL FIXATION (ORIF) MANDIBULAR FRACTURE
Anesthesia: General

## 2019-03-26 MED ORDER — LABETALOL HCL 5 MG/ML IV SOLN
INTRAVENOUS | Status: AC
  Filled 2019-03-26: qty 4

## 2019-03-26 MED ORDER — SODIUM CHLORIDE 0.9 % IV BOLUS
1000.0000 mL | Freq: Once | INTRAVENOUS | Status: AC
  Administered 2019-03-26: 01:00:00 1000 mL via INTRAVENOUS

## 2019-03-26 MED ORDER — MIDAZOLAM HCL 5 MG/5ML IJ SOLN
INTRAMUSCULAR | Status: DC | PRN
  Administered 2019-03-26: 2 mg via INTRAVENOUS

## 2019-03-26 MED ORDER — PROPOFOL 10 MG/ML IV BOLUS
INTRAVENOUS | Status: AC
  Filled 2019-03-26: qty 20

## 2019-03-26 MED ORDER — LIDOCAINE-EPINEPHRINE 1 %-1:100000 IJ SOLN
INTRAMUSCULAR | Status: DC | PRN
  Administered 2019-03-26: 2 mL

## 2019-03-26 MED ORDER — LIDOCAINE-EPINEPHRINE 1 %-1:100000 IJ SOLN
INTRAMUSCULAR | Status: AC
  Filled 2019-03-26: qty 1

## 2019-03-26 MED ORDER — FENTANYL CITRATE (PF) 250 MCG/5ML IJ SOLN
INTRAMUSCULAR | Status: AC
  Filled 2019-03-26: qty 5

## 2019-03-26 MED ORDER — HYDROCODONE-ACETAMINOPHEN 7.5-325 MG/15ML PO SOLN: 10.0000 mL | ORAL | Status: DC | PRN

## 2019-03-26 MED ORDER — CLINDAMYCIN PHOSPHATE 600 MG/50ML IV SOLN
600.0000 mg | Freq: Four times a day (QID) | INTRAVENOUS | Status: DC
  Administered 2019-03-26: 600 mg via INTRAVENOUS
  Filled 2019-03-26: qty 50

## 2019-03-26 MED ORDER — IBUPROFEN 400 MG PO TABS: 400.0000 mg | ORAL_TABLET | Freq: Four times a day (QID) | ORAL | Status: DC | PRN

## 2019-03-26 MED ORDER — GLYCOPYRROLATE PF 0.2 MG/ML IJ SOSY
PREFILLED_SYRINGE | INTRAMUSCULAR | Status: DC | PRN
  Administered 2019-03-26: .1 mg via INTRAVENOUS

## 2019-03-26 MED ORDER — ACETAMINOPHEN 325 MG PO TABS: 650.0000 mg | ORAL_TABLET | Freq: Four times a day (QID) | ORAL | Status: DC | PRN

## 2019-03-26 MED ORDER — LIDOCAINE-EPINEPHRINE (PF) 2 %-1:200000 IJ SOLN
10.0000 mL | Freq: Once | INTRAMUSCULAR | Status: AC
  Administered 2019-03-26: 10 mL
  Filled 2019-03-26: qty 20

## 2019-03-26 MED ORDER — ACETAMINOPHEN 650 MG RE SUPP: 650.0000 mg | Freq: Four times a day (QID) | RECTAL | Status: DC | PRN

## 2019-03-26 MED ORDER — PHENYLEPHRINE 40 MCG/ML (10ML) SYRINGE FOR IV PUSH (FOR BLOOD PRESSURE SUPPORT)
PREFILLED_SYRINGE | INTRAVENOUS | Status: AC
  Filled 2019-03-26: qty 10

## 2019-03-26 MED ORDER — DEXMEDETOMIDINE HCL 200 MCG/2ML IV SOLN
INTRAVENOUS | Status: DC | PRN
  Administered 2019-03-26: 16 ug via INTRAVENOUS
  Administered 2019-03-26: 40 ug via INTRAVENOUS
  Administered 2019-03-26: 24 ug via INTRAVENOUS

## 2019-03-26 MED ORDER — LIDOCAINE 2% (20 MG/ML) 5 ML SYRINGE
INTRAMUSCULAR | Status: DC | PRN
  Administered 2019-03-26: 60 mg via INTRAVENOUS

## 2019-03-26 MED ORDER — ONDANSETRON HCL 4 MG/2ML IJ SOLN
INTRAMUSCULAR | Status: DC | PRN
  Administered 2019-03-26: 4 mg via INTRAVENOUS

## 2019-03-26 MED ORDER — OXYMETAZOLINE HCL 0.05 % NA SOLN
NASAL | Status: AC
  Filled 2019-03-26: qty 30

## 2019-03-26 MED ORDER — ONDANSETRON HCL 4 MG/2ML IJ SOLN
4.0000 mg | Freq: Once | INTRAMUSCULAR | Status: AC
  Administered 2019-03-26: 04:00:00 4 mg via INTRAVENOUS
  Filled 2019-03-26: qty 2

## 2019-03-26 MED ORDER — SUCCINYLCHOLINE CHLORIDE 200 MG/10ML IV SOSY
PREFILLED_SYRINGE | INTRAVENOUS | Status: AC
  Filled 2019-03-26: qty 10

## 2019-03-26 MED ORDER — DEXAMETHASONE SODIUM PHOSPHATE 10 MG/ML IJ SOLN
INTRAMUSCULAR | Status: DC | PRN
  Administered 2019-03-26: 10 mg via INTRAVENOUS

## 2019-03-26 MED ORDER — ONDANSETRON HCL 4 MG/2ML IJ SOLN
INTRAMUSCULAR | Status: AC
  Filled 2019-03-26: qty 2

## 2019-03-26 MED ORDER — MORPHINE SULFATE (PF) 4 MG/ML IV SOLN
4.0000 mg | Freq: Once | INTRAVENOUS | Status: AC
  Administered 2019-03-26: 04:00:00 4 mg via INTRAVENOUS
  Filled 2019-03-26: qty 1

## 2019-03-26 MED ORDER — HYDROMORPHONE HCL 1 MG/ML IJ SOLN
0.2500 mg | INTRAMUSCULAR | Status: DC | PRN
  Administered 2019-03-26 (×2): 0.5 mg via INTRAVENOUS

## 2019-03-26 MED ORDER — BACITRACIN ZINC 500 UNIT/GM EX OINT
TOPICAL_OINTMENT | CUTANEOUS | Status: AC
  Filled 2019-03-26: qty 28.35

## 2019-03-26 MED ORDER — MIDAZOLAM HCL 2 MG/2ML IJ SOLN
INTRAMUSCULAR | Status: AC
  Filled 2019-03-26: qty 2

## 2019-03-26 MED ORDER — GLYCOPYRROLATE PF 0.2 MG/ML IJ SOSY
PREFILLED_SYRINGE | INTRAMUSCULAR | Status: AC
  Filled 2019-03-26: qty 1

## 2019-03-26 MED ORDER — DEXAMETHASONE SODIUM PHOSPHATE 10 MG/ML IJ SOLN
INTRAMUSCULAR | Status: AC
  Filled 2019-03-26: qty 1

## 2019-03-26 MED ORDER — CHLORHEXIDINE GLUCONATE 0.12 % MT SOLN: 15.0000 mL | Freq: Four times a day (QID) | OROMUCOSAL | Status: DC

## 2019-03-26 MED ORDER — SUCCINYLCHOLINE CHLORIDE 200 MG/10ML IV SOSY
PREFILLED_SYRINGE | INTRAVENOUS | Status: DC | PRN
  Administered 2019-03-26: 80 mg via INTRAVENOUS

## 2019-03-26 MED ORDER — PROPOFOL 10 MG/ML IV BOLUS
INTRAVENOUS | Status: DC | PRN
  Administered 2019-03-26: 70 mg via INTRAVENOUS
  Administered 2019-03-26: 200 mg via INTRAVENOUS

## 2019-03-26 MED ORDER — LABETALOL HCL 5 MG/ML IV SOLN
10.0000 mg | Freq: Once | INTRAVENOUS | Status: AC
  Administered 2019-03-26: 10 mg via INTRAVENOUS

## 2019-03-26 MED ORDER — DEXMEDETOMIDINE HCL IN NACL 200 MCG/50ML IV SOLN
INTRAVENOUS | Status: AC
  Filled 2019-03-26: qty 50

## 2019-03-26 MED ORDER — LABETALOL HCL 5 MG/ML IV SOLN
INTRAVENOUS | Status: DC | PRN
  Administered 2019-03-26 (×2): 5 mg via INTRAVENOUS

## 2019-03-26 MED ORDER — LACTATED RINGERS IV SOLN
INTRAVENOUS | Status: DC
  Administered 2019-03-26 (×2): via INTRAVENOUS

## 2019-03-26 MED ORDER — 0.9 % SODIUM CHLORIDE (POUR BTL) OPTIME
TOPICAL | Status: DC | PRN
  Administered 2019-03-26: 10:00:00 1000 mL

## 2019-03-26 MED ORDER — LIDOCAINE 2% (20 MG/ML) 5 ML SYRINGE
INTRAMUSCULAR | Status: AC
  Filled 2019-03-26: qty 5

## 2019-03-26 MED ORDER — HYDROCODONE-ACETAMINOPHEN 7.5-325 MG PO TABS: 1.0000 | ORAL_TABLET | Freq: Four times a day (QID) | ORAL | 0 refills | Status: DC | PRN

## 2019-03-26 MED ORDER — CLINDAMYCIN HCL 300 MG PO CAPS: 300.0000 mg | ORAL_CAPSULE | Freq: Three times a day (TID) | ORAL | 0 refills | Status: DC

## 2019-03-26 MED ORDER — CLINDAMYCIN PHOSPHATE 600 MG/50ML IV SOLN: 600.0000 mg | Freq: Four times a day (QID) | INTRAVENOUS | Status: DC

## 2019-03-26 MED ORDER — IBUPROFEN 100 MG/5ML PO SUSP
400.0000 mg | Freq: Four times a day (QID) | ORAL | Status: DC | PRN
  Filled 2019-03-26: qty 20

## 2019-03-26 MED ORDER — BACITRACIN ZINC 500 UNIT/GM EX OINT
TOPICAL_OINTMENT | CUTANEOUS | Status: DC | PRN
  Administered 2019-03-26: 1 via TOPICAL

## 2019-03-26 MED ORDER — PROMETHAZINE HCL 25 MG RE SUPP: 25.0000 mg | Freq: Four times a day (QID) | RECTAL | 1 refills | Status: DC | PRN

## 2019-03-26 MED ORDER — DEXTROSE-NACL 5-0.9 % IV SOLN
INTRAVENOUS | Status: DC
  Administered 2019-03-26: 12:00:00 via INTRAVENOUS

## 2019-03-26 MED ORDER — PHENYLEPHRINE 40 MCG/ML (10ML) SYRINGE FOR IV PUSH (FOR BLOOD PRESSURE SUPPORT)
PREFILLED_SYRINGE | INTRAVENOUS | Status: DC | PRN
  Administered 2019-03-26: 80 ug via INTRAVENOUS

## 2019-03-26 MED ORDER — ROCURONIUM BROMIDE 50 MG/5ML IV SOSY
PREFILLED_SYRINGE | INTRAVENOUS | Status: AC
  Filled 2019-03-26: qty 5

## 2019-03-26 MED ORDER — HYDROMORPHONE HCL 1 MG/ML IJ SOLN
INTRAMUSCULAR | Status: AC
  Filled 2019-03-26: qty 1

## 2019-03-26 MED ORDER — LACTATED RINGERS IV SOLN
INTRAVENOUS | Status: DC | PRN
  Administered 2019-03-26: 08:00:00 via INTRAVENOUS

## 2019-03-26 MED ORDER — FENTANYL CITRATE (PF) 100 MCG/2ML IJ SOLN
INTRAMUSCULAR | Status: DC | PRN
  Administered 2019-03-26: 100 ug via INTRAVENOUS
  Administered 2019-03-26 (×5): 50 ug via INTRAVENOUS

## 2019-03-26 MED ORDER — OXYMETAZOLINE HCL 0.05 % NA SOLN
2.0000 | NASAL | Status: AC
  Administered 2019-03-26 (×2): 2 via NASAL
  Administered 2019-03-26: 09:00:00 1 via NASAL
  Filled 2019-03-26: qty 30

## 2019-03-26 MED ORDER — PROMETHAZINE HCL 25 MG RE SUPP
25.0000 mg | Freq: Four times a day (QID) | RECTAL | Status: DC | PRN
  Filled 2019-03-26: qty 1

## 2019-03-26 MED ORDER — PROMETHAZINE HCL 25 MG PO TABS: 25.0000 mg | ORAL_TABLET | Freq: Four times a day (QID) | ORAL | Status: DC | PRN

## 2019-03-26 MED ORDER — BACITRACIN ZINC 500 UNIT/GM EX OINT: 1.0000 "application " | TOPICAL_OINTMENT | Freq: Three times a day (TID) | CUTANEOUS | Status: DC

## 2019-03-26 MED ORDER — CLINDAMYCIN PHOSPHATE 900 MG/50ML IV SOLN
900.0000 mg | Freq: Once | INTRAVENOUS | Status: AC
  Administered 2019-03-26: 900 mg via INTRAVENOUS
  Filled 2019-03-26: qty 50

## 2019-03-26 MED ORDER — OXYCODONE HCL 5 MG PO TABS: 5.0000 mg | ORAL_TABLET | Freq: Once | ORAL | Status: DC | PRN

## 2019-03-26 MED ORDER — DEXTROSE-NACL 5-0.45 % IV SOLN
INTRAVENOUS | Status: DC
  Administered 2019-03-26: 06:00:00 via INTRAVENOUS

## 2019-03-26 MED ORDER — TETANUS-DIPHTH-ACELL PERTUSSIS 5-2.5-18.5 LF-MCG/0.5 IM SUSP
0.5000 mL | Freq: Once | INTRAMUSCULAR | Status: AC
  Administered 2019-03-26: 01:00:00 0.5 mL via INTRAMUSCULAR
  Filled 2019-03-26: qty 0.5

## 2019-03-26 MED ORDER — PROMETHAZINE HCL 25 MG/ML IJ SOLN: 6.2500 mg | INTRAMUSCULAR | Status: DC | PRN

## 2019-03-26 MED ORDER — OXYCODONE HCL 5 MG/5ML PO SOLN: 5.0000 mg | Freq: Once | ORAL | Status: DC | PRN

## 2019-03-26 SURGICAL SUPPLY — 36 items
BIT DRILL 1.9MM X 35MM (BIT) ×1 IMPLANT
BIT DRILL 1.9X35 (BIT) ×1
CLEANER TIP ELECTROSURG 2X2 (MISCELLANEOUS) ×3 IMPLANT
COVER SURGICAL LIGHT HANDLE (MISCELLANEOUS) ×3 IMPLANT
DRAIN PENROSE 1/4X12 LTX STRL (WOUND CARE) ×3 IMPLANT
DRILL BIT 1.9MM X 35MM (BIT) ×3
ELECT COATED BLADE 2.86 ST (ELECTRODE) ×3 IMPLANT
ELECT NEEDLE TIP 2.8 STRL (NEEDLE) ×3 IMPLANT
ELECT REM PT RETURN 9FT ADLT (ELECTROSURGICAL) ×3
ELECTRODE REM PT RTRN 9FT ADLT (ELECTROSURGICAL) ×1 IMPLANT
GAUZE SPONGE 4X4 12PLY STRL (GAUZE/BANDAGES/DRESSINGS) ×3 IMPLANT
GLOVE ECLIPSE 7.5 STRL STRAW (GLOVE) ×3 IMPLANT
GOWN STRL REUS W/ TWL LRG LVL3 (GOWN DISPOSABLE) ×2 IMPLANT
GOWN STRL REUS W/TWL LRG LVL3 (GOWN DISPOSABLE) ×6
KIT BASIN OR (CUSTOM PROCEDURE TRAY) ×3 IMPLANT
KIT TURNOVER KIT B (KITS) ×3 IMPLANT
NEEDLE PRECISIONGLIDE 27X1.5 (NEEDLE) ×3 IMPLANT
NS IRRIG 1000ML POUR BTL (IV SOLUTION) ×3 IMPLANT
PAD ARMBOARD 7.5X6 YLW CONV (MISCELLANEOUS) ×6 IMPLANT
PENCIL FOOT CONTROL (ELECTRODE) ×3 IMPLANT
PLATE 4H FRACTURE (Plate) ×3 IMPLANT
PLATE HYBRID GOLD MMF (Plate) ×4 IMPLANT
PLATE HYBRID MMF GOLD (Plate) ×2 IMPLANT
SCISSORS WIRE ANG 4 3/4 DISP (INSTRUMENTS) ×3 IMPLANT
SCREW LOCK SELFDRIL 2.0X8M MMF (Screw) ×12 IMPLANT
SCREW LOCKING 2.3X12MM (Screw) ×3 IMPLANT
SCREW LOCKING SELF DRILL 2.0X6 (Screw) ×24 IMPLANT
SCREW MNDBLE 2.3X16MM BONE (Screw) ×12 IMPLANT
SUT STEEL 0 (SUTURE)
SUT STEEL 0 18XMFL TIE 17 (SUTURE) IMPLANT
SUT STEEL 2 (SUTURE) ×3 IMPLANT
SUT STEEL 4 (SUTURE) IMPLANT
SUT VIC AB 3-0 SH 27 (SUTURE) ×3
SUT VIC AB 3-0 SH 27XBRD (SUTURE) ×1 IMPLANT
TOWEL NATURAL 6PK STERILE (DISPOSABLE) ×3 IMPLANT
TRAY ENT MC OR (CUSTOM PROCEDURE TRAY) ×3 IMPLANT

## 2019-03-26 NOTE — Anesthesia Procedure Notes (Signed)
Procedure Name: Intubation Date/Time: 03/26/2019 8:54 AM Performed by: Julian Reil, CRNA Pre-anesthesia Checklist: Patient identified, Emergency Drugs available, Suction available and Patient being monitored Patient Re-evaluated:Patient Re-evaluated prior to induction Oxygen Delivery Method: Circle system utilized Preoxygenation: Pre-oxygenation with 100% oxygen Induction Type: Rapid sequence Laryngoscope Size: Glidescope and 4 Grade View: Grade I Nasal Tubes: Right, Nasal Rae and Nasal prep performed Tube size: 7.5 mm Number of attempts: 1 Placement Confirmation: ETT inserted through vocal cords under direct vision,  positive ETCO2 and breath sounds checked- equal and bilateral Secured at: 27 cm Tube secured with: Tape Dental Injury: Teeth and Oropharynx as per pre-operative assessment  Comments: RSI due to Covid-19 pandemic concerns. Nasal airway lubricated placed in bilat nare prior to NTT. Glidescope electively used to facilitate NTT placement.
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
## 2019-03-26 NOTE — Progress Notes (Signed)
Ellender MD notified twice of blood pressure of 169/116 per verbal order gave 10 mg labetalol and 148/118 per verbal order gave 10 mg of labetalol. MD states max dose to be 20 mg of labetalol total. Patient's last blood pressure is 147/99 which is his baseline, I will continue to monitor.

## 2019-03-26 NOTE — ED Notes (Signed)
Patient's mother Saveer Bivens updated on patient's admission and OR surgery-Monique,RN
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
## 2019-03-26 NOTE — Discharge Instructions (Signed)
Drink liquids only.  You may put anything you want in a blender.  You may buy protein shakes.  You may have smoothies or milkshakes.  Brush your teeth twice daily and rinse your mouth with peroxide 4 times daily.  If you feel the need to vomit and if necessary cut the 4 wires that are holding your upper and lower jaw together.  You will then be able to open your mouth.  Only do this in case of emergency.  Contact Dr. Pollyann Kennedy if this happens.  These wires need to stay in place for 6 weeks to allow for proper healing.  On Sunday you may remove the dressing, there is a rubber drain coming out of the skin under your chin.  There is a small stitch holding this in place.  You may cut the stitch with scissors and pull the drain out.  If you are not comfortable doing this contact our office and we will have you come in Monday so that we can remove the drain.  Keep that area clean and dry.  Apply antibiotic ointment twice daily.

## 2019-03-26 NOTE — Op Note (Signed)
OPERATIVE REPORT  DATE OF SURGERY: 03/26/2019  PATIENT:  Raymond Ford,  43 y.o. male  PRE-OPERATIVE DIAGNOSIS:  MANDIBLE FRACTURE  POST-OPERATIVE DIAGNOSIS:  MANDIBLE FRACTURE  PROCEDURE:  Procedure(s): OPEN REDUCTION INTERNAL FIXATION (ORIF) MANDIBULAR FRACTURE  SURGEON:  Susy Frizzle, MD  ASSISTANTS: None  ANESTHESIA:   General   EBL: 50 ml  DRAINS: Penrose drain  LOCAL MEDICATIONS USED: 1% Xylocaine with epinephrine  SPECIMEN:  none  COUNTS:  Correct  PROCEDURE DETAILS: The patient was taken to the operating room and placed on the operating table in the supine position. Following induction of general nasotracheal anesthesia, the lower face and upper neck were prepped and draped in a standard fashion.  The [employer] modified arch bars were used.  Upper arch bar was placed into position using multiple 6 mm screws.  The lower arch bar was placed into position on the left side of the mandible of the fracture, using 6 mm screws.  The right side was left loose until the open reduction was performed.  An irregular laceration in the submental area approximately 5 cm in length was opened and irrigated.  Electrocautery and Therapist, nutritional were used to expose the bone fracture.  The fracture was reduced with a bone hook.  A 4 hole straight 2.3 mandible fracture plate was used.  The plate was bent to the appropriate shape.  The left side was secured in place with two 67mm screws.  The right side was then secured using a 14 and a 10 mm screw.  Pilot holes were drilled initially.  The fracture was nicely reduced and secured in place.    This then allowed adequate occlusal alignment on the right side so that the right side of the arch bar was then secured in place.  Wire loops were initially placed on the left side prior to the open reduction and then the right side was fixated as well and MMF.  The oral cavity was suctioned and irrigated with saline.  A laceration of the right lower inner  labial mucosa was repaired using running 4-0 Vicryl suture.    The external wound was irrigated with saline.  1/4 inch Penrose was placed into position and secured in place with a Vicryl suture.  The deep muscular layer was reapproximated with interrupted 4-0 Vicryl.  Running 4-0 Vicryl was used to reapproximate the skin.  Bacitracin was applied and a sterile dressing.  Patient was then awakened extubated and transferred to recovery in stable condition.    PATIENT DISPOSITION:  To PACU, stable
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
## 2019-03-26 NOTE — Anesthesia Postprocedure Evaluation (Signed)
Anesthesia Post Note  Patient: Raymond Ford  Procedure(s) Performed: OPEN REDUCTION INTERNAL FIXATION (ORIF) MANDIBULAR FRACTURE (N/A )     Patient location during evaluation: PACU Anesthesia Type: General Level of consciousness: awake and alert Pain management: pain level controlled Vital Signs Assessment: post-procedure vital signs reviewed and stable Respiratory status: spontaneous breathing, nonlabored ventilation, respiratory function stable and patient connected to nasal cannula oxygen Cardiovascular status: blood pressure returned to baseline and stable Postop Assessment: no apparent nausea or vomiting Anesthetic complications: no    Last Vitals:  Vitals:   03/26/19 1155 03/26/19 1334  BP: (!) 144/90 (!) 158/99  Pulse: 72 76  Resp: 15   Temp: 36.9 C (!) 36.4 C  SpO2: 93% 92%    Last Pain:  Vitals:   03/26/19 1334  TempSrc: Oral  PainSc:                  Raymond Ford

## 2019-03-26 NOTE — Progress Notes (Signed)
Patient wants to leave AMA. Informed patient that I would call MD and let him know. Patient states he is ready to leave either way. MD notified.
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
## 2019-03-26 NOTE — ED Notes (Signed)
After speaking with EDP Nurse Navigator called pt mother back with pt permission to notify her that pt will most likely be discharged.

## 2019-03-26 NOTE — Progress Notes (Signed)
Raymond Ford to be D/C'd per MD order. Discussed with the patient and all questions fully answered. ? VSS, Skin clean, dry and intact without evidence of skin break down, no evidence of skin tears noted. ? IV catheter discontinued intact. Site without signs and symptoms of complications. Dressing and pressure applied. ? An After Visit Summary was printed and given to the patient. Patient informed where to pickup prescriptions. ? D/c education completed with patient/family including follow up instructions, medication list, d/c activities limitations if indicated, with other d/c instructions as indicated by MD - patient able to verbalize understanding, all questions fully answered.  ? Patient instructed to return to ED, call 911, or call MD for any changes in condition.  ? Patient to be escorted via WC, and D/C home via private auto.

## 2019-03-26 NOTE — Discharge Summary (Signed)
  Physician Discharge Summary  Patient ID: Raymond Ford MRN: [id number] DOB/AGE: 06-Mar-1976 43 y.o.  Admit date: 03/25/2019 Discharge date: 03/26/2019  Admission Diagnoses: Mandible fracture  Discharge Diagnoses:  Active Problems:   Mandible fracture (HCC)   Open symphysis mandibular fracture (HCC)   Discharged Condition: good  Hospital Course: No complications  Consults: none  Significant Diagnostic Studies: none  Treatments: surgery: Open reduction internal fixation of mandible fracture and maxillomandibular fixation with arch bars.  Discharge Exam: Blood pressure (!) 158/99, pulse 76, temperature (!) 97.5 F (36.4 C), temperature source Oral, resp. rate 15, SpO2 92 %. PHYSICAL EXAM: Patient is awake and alert.  Examination not performed because patient wanted to leave the hospital before I was able to come see him.  Disposition: Discharge disposition: 01-Home or Self Care       Discharge Instructions    Diet - low sodium heart healthy   Complete by:  As directed    Increase activity slowly   Complete by:  As directed      Allergies as of 03/26/2019   No Known Allergies     Medication List    TAKE these medications   clindamycin 300 MG capsule Commonly known as:  Cleocin Take 1 capsule (300 mg total) by mouth 3 (three) times daily.   HYDROcodone-acetaminophen 7.5-325 MG tablet Commonly known as:  Norco Take 1 tablet by mouth every 6 (six) hours as needed for moderate pain.   ibuprofen 800 MG tablet Commonly known as:  ADVIL Take 1 tablet (800 mg total) by mouth 3 (three) times daily.   promethazine 25 MG suppository Commonly known as:  PHENERGAN Place 1 suppository (25 mg total) rectally every 6 (six) hours as needed for nausea or vomiting.      Follow-up Information    Serena Colonel, MD. Schedule an appointment as soon as possible for a visit in 1 week(s).   Specialty:  Otolaryngology Contact information: [address] [place] [address] [phone]           Signed: Serena Colonel 03/26/2019, 2:19 PM

## 2019-03-26 NOTE — Transfer of Care (Signed)
Immediate Anesthesia Transfer of Care Note  Patient: Raymond Ford  Procedure(s) Performed: OPEN REDUCTION INTERNAL FIXATION (ORIF) MANDIBULAR FRACTURE (N/A )  Patient Location: PACU  Anesthesia Type:General  Level of Consciousness: drowsy and patient cooperative  Airway & Oxygen Therapy: Patient Spontanous Breathing and Patient connected to face mask oxygen  Post-op Assessment: Report given to RN, Post -op Vital signs reviewed and stable and Patient moving all extremities X 4  Post vital signs: Reviewed and stable  Last Vitals:  Vitals Value Taken Time  BP 166/104 03/26/2019 10:27 AM  Temp    Pulse 88 03/26/2019 10:28 AM  Resp 14 03/26/2019 10:28 AM  SpO2 96 % 03/26/2019 10:28 AM  Vitals shown include unvalidated device data.  Last Pain:  Vitals:   03/26/19 0533  TempSrc: Oral  PainSc:          Complications: No apparent anesthesia complications

## 2019-03-29 ENCOUNTER — Emergency Department (HOSPITAL_COMMUNITY)
Admission: EM | Admit: 2019-03-29 | Discharge: 2019-03-30 | Disposition: A | Discharge status: Home / Self Care | Payer: Self-pay | Attending: Emergency Medicine | Admitting: Emergency Medicine

## 2019-03-29 ENCOUNTER — Encounter (HOSPITAL_COMMUNITY): Payer: Self-pay | Admitting: *Deleted

## 2019-03-29 ENCOUNTER — Other Ambulatory Visit: Payer: Self-pay

## 2019-03-29 DIAGNOSIS — F1290 Cannabis use, unspecified, uncomplicated: Secondary | ICD-10-CM | POA: Insufficient documentation

## 2019-03-29 DIAGNOSIS — H9222 Otorrhagia, left ear: Secondary | ICD-10-CM | POA: Insufficient documentation

## 2019-03-29 DIAGNOSIS — R590 Localized enlarged lymph nodes: Secondary | ICD-10-CM | POA: Insufficient documentation

## 2019-03-29 DIAGNOSIS — R42 Dizziness and giddiness: Secondary | ICD-10-CM | POA: Insufficient documentation

## 2019-03-29 DIAGNOSIS — G8918 Other acute postprocedural pain: Secondary | ICD-10-CM | POA: Insufficient documentation

## 2019-03-29 DIAGNOSIS — F17210 Nicotine dependence, cigarettes, uncomplicated: Secondary | ICD-10-CM | POA: Insufficient documentation

## 2019-03-29 HISTORY — DX: Fracture of mandible, unspecified, initial encounter for closed fracture: S02.609A
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
## 2019-03-30 ENCOUNTER — Encounter (HOSPITAL_COMMUNITY): Payer: Self-pay | Admitting: Otolaryngology

## 2019-03-30 ENCOUNTER — Emergency Department (HOSPITAL_COMMUNITY): Payer: Self-pay

## 2019-03-30 LAB — CBC WITH DIFFERENTIAL/PLATELET
Abs Immature Granulocytes: 0.04 10*3/uL (ref 0.00–0.07)
Basophils Absolute: 0 10*3/uL (ref 0.0–0.1)
Basophils Relative: 0 %
Eosinophils Absolute: 0.8 10*3/uL — ABNORMAL HIGH (ref 0.0–0.5)
Eosinophils Relative: 7 %
HCT: 43.5 % (ref 39.0–52.0)
Hemoglobin: 14.6 g/dL (ref 13.0–17.0)
Immature Granulocytes: 0 %
Lymphocytes Relative: 22 %
Lymphs Abs: 2.4 10*3/uL (ref 0.7–4.0)
MCH: 30.5 pg (ref 26.0–34.0)
MCHC: 33.6 g/dL (ref 30.0–36.0)
MCV: 91 fL (ref 80.0–100.0)
Monocytes Absolute: 0.9 10*3/uL (ref 0.1–1.0)
Monocytes Relative: 8 %
Neutro Abs: 7.1 10*3/uL (ref 1.7–7.7)
Neutrophils Relative %: 63 %
Platelets: 338 10*3/uL (ref 150–400)
RBC: 4.78 MIL/uL (ref 4.22–5.81)
RDW: 11.4 % — ABNORMAL LOW (ref 11.5–15.5)
WBC: 11.2 10*3/uL — ABNORMAL HIGH (ref 4.0–10.5)
nRBC: 0 % (ref 0.0–0.2)

## 2019-03-30 LAB — BASIC METABOLIC PANEL
Anion gap: 11 (ref 5–15)
BUN: 14 mg/dL (ref 6–20)
CO2: 23 mmol/L (ref 22–32)
Calcium: 9.4 mg/dL (ref 8.9–10.3)
Chloride: 102 mmol/L (ref 98–111)
Creatinine, Ser: 0.95 mg/dL (ref 0.61–1.24)
GFR calc Af Amer: >60 mL/min (ref >60)
GFR calc non Af Amer: >60 mL/min (ref >60)
Glucose, Bld: 97 mg/dL (ref 70–99)
Potassium: 4.3 mmol/L (ref 3.5–5.1)
Sodium: 136 mmol/L (ref 135–145)

## 2019-03-30 LAB — LACTIC ACID, PLASMA
Lactic Acid, Venous: 0.7 mmol/L (ref 0.5–1.9)
Lactic Acid, Venous: 0.7 mmol/L (ref 0.5–1.9)

## 2019-03-30 MED ORDER — DEXAMETHASONE 1 MG/ML PO CONC
10.0000 mg | Freq: Once | ORAL | Status: AC
  Administered 2019-03-30: 10 mg via ORAL
  Filled 2019-03-30: qty 10

## 2019-03-30 MED ORDER — IBUPROFEN 100 MG/5ML PO SUSP: 600.0000 mg | Freq: Four times a day (QID) | ORAL | 0 refills | Status: DC

## 2019-03-30 MED ORDER — VANCOMYCIN HCL 10 G IV SOLR
1250.0000 mg | Freq: Two times a day (BID) | INTRAVENOUS | Status: DC
  Filled 2019-03-30 (×2): qty 1250

## 2019-03-30 MED ORDER — HYDROCODONE-ACETAMINOPHEN 7.5-325 MG/15ML PO SOLN: 10.0000 mL | Freq: Four times a day (QID) | ORAL | 0 refills | Status: DC | PRN

## 2019-03-30 MED ORDER — IOHEXOL 300 MG/ML  SOLN
75.0000 mL | Freq: Once | INTRAMUSCULAR | Status: AC | PRN
  Administered 2019-03-30: 75 mL via INTRAVENOUS

## 2019-03-30 MED ORDER — VANCOMYCIN HCL 10 G IV SOLR
1500.0000 mg | INTRAVENOUS | Status: AC
  Administered 2019-03-30: 1500 mg via INTRAVENOUS
  Filled 2019-03-30: qty 1500

## 2019-03-30 MED ORDER — HYDROMORPHONE HCL 1 MG/ML IJ SOLN
1.0000 mg | Freq: Once | INTRAMUSCULAR | Status: AC
  Administered 2019-03-30: 1 mg via INTRAVENOUS
  Filled 2019-03-30: qty 1

## 2019-03-30 MED ORDER — SODIUM CHLORIDE 0.9 % IV BOLUS
1000.0000 mL | Freq: Once | INTRAVENOUS | Status: AC
  Administered 2019-03-30: 1000 mL via INTRAVENOUS

## 2019-03-30 MED ORDER — DEXAMETHASONE 1 MG/ML PO CONC: 10.0000 mg | Freq: Once | ORAL | Status: DC
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
## 2019-03-30 NOTE — Discharge Instructions (Addendum)
Thank you for allowing me to care for you today in the Emergency Department.   Please call Dr. Lucky Rathke office first thing tomorrow morning.  Make sure to tell them that you are calling regarding a follow-up appointment after going to the ER last night.  They will want to see you in the clinic tomorrow.  I called you in a prescription of liquid ibuprofen for pain control. This will also help with swelling.  It is safe to take this in addition to the Norco.  You may take a dose once every 6 hours.  Clindamycin is your antibiotic. Take one capsule every 8 hours. Since these are capsules, you can actually open them up and sprinkle the medicine in with apple sauce or other soft foods to make it easier to swallow.   I called you in a prescription for liquid Norco, a narcotic pain medication. Do not take this in addition to the pills. Only take one or the other. Take as directed on the label. It is a narcotic and can be addicting. Do not work or drive while taking this medication because it can cause you to be impaired.   Return to the emergency department if you feel as if your throat is closing, if you develop a high fever, chills, significantly worsening pain or swelling, or other new, concerning symptoms.
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
## 2019-03-30 NOTE — ED Notes (Signed)
Signature pad not working. 

## 2019-03-30 NOTE — ED Notes (Signed)
Pt discharged from ED; instructions provided and scripts given; Pt encouraged to return to ED if symptoms worsen and to f/u with PCP; Pt verbalized understanding of all instructions 

## 2019-03-31 ENCOUNTER — Encounter (HOSPITAL_COMMUNITY): Payer: Self-pay | Admitting: Otolaryngology

## 2019-04-01 ENCOUNTER — Emergency Department (HOSPITAL_COMMUNITY)
Admission: EM | Admit: 2019-04-01 | Discharge: 2019-04-01 | Disposition: A | Discharge status: Home / Self Care | Payer: Self-pay | Attending: Emergency Medicine | Admitting: Emergency Medicine

## 2019-04-01 ENCOUNTER — Emergency Department (HOSPITAL_COMMUNITY): Payer: Self-pay

## 2019-04-01 ENCOUNTER — Other Ambulatory Visit: Payer: Self-pay

## 2019-04-01 DIAGNOSIS — R51 Headache: Secondary | ICD-10-CM | POA: Insufficient documentation

## 2019-04-01 DIAGNOSIS — F17210 Nicotine dependence, cigarettes, uncomplicated: Secondary | ICD-10-CM | POA: Insufficient documentation

## 2019-04-01 DIAGNOSIS — Y909 Presence of alcohol in blood, level not specified: Secondary | ICD-10-CM | POA: Insufficient documentation

## 2019-04-01 DIAGNOSIS — F1910 Other psychoactive substance abuse, uncomplicated: Secondary | ICD-10-CM | POA: Insufficient documentation

## 2019-04-01 DIAGNOSIS — W19XXXA Unspecified fall, initial encounter: Secondary | ICD-10-CM | POA: Insufficient documentation

## 2019-04-01 DIAGNOSIS — R6884 Jaw pain: Secondary | ICD-10-CM | POA: Insufficient documentation

## 2019-04-01 DIAGNOSIS — F10920 Alcohol use, unspecified with intoxication, uncomplicated: Secondary | ICD-10-CM | POA: Insufficient documentation

## 2019-04-01 DIAGNOSIS — J029 Acute pharyngitis, unspecified: Secondary | ICD-10-CM | POA: Insufficient documentation

## 2019-04-01 DIAGNOSIS — Y939 Activity, unspecified: Secondary | ICD-10-CM | POA: Insufficient documentation

## 2019-04-01 DIAGNOSIS — Y999 Unspecified external cause status: Secondary | ICD-10-CM | POA: Insufficient documentation

## 2019-04-01 DIAGNOSIS — Y92008 Other place in unspecified non-institutional (private) residence as the place of occurrence of the external cause: Secondary | ICD-10-CM | POA: Insufficient documentation

## 2019-04-01 LAB — RAPID URINE DRUG SCREEN, HOSP PERFORMED
Amphetamines: NOT DETECTED
Barbiturates: NOT DETECTED
Benzodiazepines: NOT DETECTED
Cocaine: POSITIVE — AB
Opiates: NOT DETECTED
Tetrahydrocannabinol: POSITIVE — AB
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
## 2019-04-01 NOTE — ED Notes (Signed)
Patient transported to CT 

## 2019-04-01 NOTE — Discharge Instructions (Addendum)
Thank you for allowing me to care for you today in the Emergency Department.   Your imaging of your head, neck, and face today did not show that you disrupted the hardware from your surgery or any new broken bones, which is very fortunate since breaking the hardware so soon after surgery can be very difficult to fix.  It is very important that you do not drink alcohol while taking narcotic pain medication from your surgery.  The two substances together can make you very drowsy, off balance, and more likely to fall.  You should absolutely not drink any alcohol while you are taking narcotic pain medication.  You should not take more tablets than your prescription states or take the medication more frequently than your prescription indicates.  It is very important you continue to take your antibiotic, clindamycin, to avoid infection.  Please try to not miss any doses.  For mild to moderate pain, you can take ibuprofen.  Try to only use the narcotic pain medication hydrocodone for severe, uncontrollable pain.  You have not had a fever since arriving in the ER.  I suspect that the number that they received in route of 100.4 was an equipment error as forehead thermometers are not as accurate.  You also had a very thorough work-up for infection in the ER 2 days ago.  I have read Dr. Lucky Rathke note from the office yesterday and he also felt reassured that you did not have any infection at this time.  Return to the emergency department if you have any fall or injury, if you develop significant swelling to your face or neck, thick, mucus-like drainage from the area, or other new, concerning symptoms.

## 2019-04-19 ENCOUNTER — Encounter: Payer: Self-pay | Admitting: Primary Care

## 2019-04-19 ENCOUNTER — Ambulatory Visit: Payer: Self-pay | Attending: Primary Care | Admitting: Primary Care

## 2019-04-19 ENCOUNTER — Other Ambulatory Visit: Payer: Self-pay

## 2019-04-19 DIAGNOSIS — Z7689 Persons encountering health services in other specified circumstances: Secondary | ICD-10-CM

## 2019-04-19 DIAGNOSIS — F321 Major depressive disorder, single episode, moderate: Secondary | ICD-10-CM

## 2019-04-19 DIAGNOSIS — Z09 Encounter for follow-up examination after completed treatment for conditions other than malignant neoplasm: Secondary | ICD-10-CM

## 2019-04-19 DIAGNOSIS — I10 Essential (primary) hypertension: Secondary | ICD-10-CM

## 2019-04-19 DIAGNOSIS — F1990 Other psychoactive substance use, unspecified, uncomplicated: Secondary | ICD-10-CM

## 2019-04-19 DIAGNOSIS — S02600D Fracture of unspecified part of body of mandible, subsequent encounter for fracture with routine healing: Secondary | ICD-10-CM

## 2019-04-19 MED ORDER — HYDROCHLOROTHIAZIDE 25 MG PO TABS: 25.0000 mg | ORAL_TABLET | Freq: Every day | ORAL | 3 refills | Status: DC
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
## 2019-04-19 NOTE — Progress Notes (Signed)
Patient verified DOB Patient has not taken medication and has not eaten. Patient  Patient complains  Of current jaw pain and is scaled at an 8. Patient complains of left ear pain with minimal dry blood and pain when laying his head on that side.

## 2019-04-30 ENCOUNTER — Other Ambulatory Visit: Payer: Self-pay

## 2019-04-30 ENCOUNTER — Emergency Department (HOSPITAL_COMMUNITY): Payer: Self-pay

## 2019-04-30 ENCOUNTER — Emergency Department (HOSPITAL_COMMUNITY)
Admission: EM | Admit: 2019-04-30 | Discharge: 2019-04-30 | Disposition: A | Discharge status: Home / Self Care | Payer: Self-pay | Attending: Emergency Medicine | Admitting: Emergency Medicine

## 2019-04-30 DIAGNOSIS — R197 Diarrhea, unspecified: Secondary | ICD-10-CM | POA: Insufficient documentation

## 2019-04-30 DIAGNOSIS — R0789 Other chest pain: Secondary | ICD-10-CM | POA: Insufficient documentation

## 2019-04-30 DIAGNOSIS — I10 Essential (primary) hypertension: Secondary | ICD-10-CM | POA: Insufficient documentation

## 2019-04-30 DIAGNOSIS — R1031 Right lower quadrant pain: Secondary | ICD-10-CM | POA: Insufficient documentation

## 2019-04-30 DIAGNOSIS — R112 Nausea with vomiting, unspecified: Secondary | ICD-10-CM | POA: Insufficient documentation

## 2019-04-30 DIAGNOSIS — F17210 Nicotine dependence, cigarettes, uncomplicated: Secondary | ICD-10-CM | POA: Insufficient documentation

## 2019-04-30 LAB — COMPREHENSIVE METABOLIC PANEL
ALT: 31 U/L (ref 0–44)
AST: 30 U/L (ref 15–41)
Albumin: 4.2 g/dL (ref 3.5–5.0)
Alkaline Phosphatase: 99 U/L (ref 38–126)
Anion gap: 11 (ref 5–15)
BUN: <5 mg/dL — ABNORMAL LOW (ref 6–20)
CO2: 25 mmol/L (ref 22–32)
Calcium: 9.3 mg/dL (ref 8.9–10.3)
Chloride: 104 mmol/L (ref 98–111)
Creatinine, Ser: 0.99 mg/dL (ref 0.61–1.24)
GFR calc Af Amer: >60 mL/min (ref >60)
GFR calc non Af Amer: >60 mL/min (ref >60)
Glucose, Bld: 105 mg/dL — ABNORMAL HIGH (ref 70–99)
Potassium: 3.5 mmol/L (ref 3.5–5.1)
Sodium: 140 mmol/L (ref 135–145)
Total Bilirubin: 0.5 mg/dL (ref 0.3–1.2)
Total Protein: 7.6 g/dL (ref 6.5–8.1)

## 2019-04-30 LAB — CBC WITH DIFFERENTIAL/PLATELET
Abs Immature Granulocytes: 0.02 10*3/uL (ref 0.00–0.07)
Basophils Absolute: 0 10*3/uL (ref 0.0–0.1)
Basophils Relative: 0 %
Eosinophils Absolute: 0.1 10*3/uL (ref 0.0–0.5)
Eosinophils Relative: 1 %
HCT: 46.4 % (ref 39.0–52.0)
Hemoglobin: 15.4 g/dL (ref 13.0–17.0)
Immature Granulocytes: 0 %
Lymphocytes Relative: 14 %
Lymphs Abs: 1.3 10*3/uL (ref 0.7–4.0)
MCH: 30.7 pg (ref 26.0–34.0)
MCHC: 33.2 g/dL (ref 30.0–36.0)
MCV: 92.4 fL (ref 80.0–100.0)
Monocytes Absolute: 0.5 10*3/uL (ref 0.1–1.0)
Monocytes Relative: 5 %
Neutro Abs: 7.7 10*3/uL (ref 1.7–7.7)
Neutrophils Relative %: 80 %
Platelets: 255 10*3/uL (ref 150–400)
RBC: 5.02 MIL/uL (ref 4.22–5.81)
RDW: 12.1 % (ref 11.5–15.5)
WBC: 9.6 10*3/uL (ref 4.0–10.5)
nRBC: 0 % (ref 0.0–0.2)

## 2019-04-30 LAB — TROPONIN I
Troponin I: <0.03 ng/mL (ref <0.03)
Troponin I: <0.03 ng/mL (ref <0.03)

## 2019-04-30 LAB — URINALYSIS, ROUTINE W REFLEX MICROSCOPIC
Bacteria, UA: NONE SEEN
Bilirubin Urine: NEGATIVE
Glucose, UA: NEGATIVE mg/dL
Hgb urine dipstick: NEGATIVE
Ketones, ur: NEGATIVE mg/dL
Nitrite: NEGATIVE
Protein, ur: NEGATIVE mg/dL
Specific Gravity, Urine: >1.046 — ABNORMAL HIGH (ref 1.005–1.030)
pH: 6 (ref 5.0–8.0)

## 2019-04-30 LAB — MAGNESIUM: Magnesium: 2 mg/dL (ref 1.7–2.4)

## 2019-04-30 LAB — RAPID URINE DRUG SCREEN, HOSP PERFORMED
Amphetamines: NOT DETECTED
Barbiturates: NOT DETECTED
Benzodiazepines: NOT DETECTED
Cocaine: POSITIVE — AB
Opiates: POSITIVE — AB
Tetrahydrocannabinol: POSITIVE — AB

## 2019-04-30 LAB — LIPASE, BLOOD: Lipase: 27 U/L (ref 11–51)

## 2019-04-30 LAB — PHOSPHORUS: Phosphorus: 3.5 mg/dL (ref 2.5–4.6)

## 2019-04-30 LAB — LACTIC ACID, PLASMA: Lactic Acid, Venous: 1.7 mmol/L (ref 0.5–1.9)

## 2019-04-30 MED ORDER — ONDANSETRON HCL 4 MG PO TABS: 4.0000 mg | ORAL_TABLET | Freq: Three times a day (TID) | ORAL | 0 refills | Status: DC | PRN

## 2019-04-30 MED ORDER — ONDANSETRON 4 MG PO TBDP
4.0000 mg | ORAL_TABLET | Freq: Once | ORAL | Status: DC
  Filled 2019-04-30: qty 1

## 2019-04-30 MED ORDER — IOHEXOL 350 MG/ML SOLN
100.0000 mL | Freq: Once | INTRAVENOUS | Status: AC | PRN
  Administered 2019-04-30: 100 mL via INTRAVENOUS

## 2019-04-30 MED ORDER — MORPHINE SULFATE (PF) 4 MG/ML IV SOLN
4.0000 mg | Freq: Once | INTRAVENOUS | Status: AC
  Administered 2019-04-30: 4 mg via INTRAVENOUS
  Filled 2019-04-30: qty 1

## 2019-04-30 MED ORDER — ONDANSETRON HCL 4 MG/5ML PO SOLN: 4.0000 mg | Freq: Three times a day (TID) | ORAL | 0 refills | Status: DC | PRN

## 2019-04-30 MED ORDER — LACTATED RINGERS IV BOLUS
1000.0000 mL | Freq: Once | INTRAVENOUS | Status: AC
  Administered 2019-04-30: 15:00:00 1000 mL via INTRAVENOUS

## 2019-04-30 MED ORDER — SODIUM CHLORIDE 0.9 % IV BOLUS
1000.0000 mL | Freq: Once | INTRAVENOUS | Status: AC
  Administered 2019-04-30: 1000 mL via INTRAVENOUS

## 2019-04-30 MED ORDER — ONDANSETRON 4 MG PO TBDP
4.0000 mg | ORAL_TABLET | Freq: Once | ORAL | Status: AC
  Administered 2019-04-30: 4 mg via ORAL

## 2019-04-30 MED ORDER — LORAZEPAM 2 MG/ML IJ SOLN
1.0000 mg | Freq: Once | INTRAMUSCULAR | Status: AC
  Administered 2019-04-30: 1 mg via INTRAVENOUS
  Filled 2019-04-30: qty 1

## 2019-04-30 MED ORDER — IOHEXOL 300 MG/ML  SOLN
100.0000 mL | Freq: Once | INTRAMUSCULAR | Status: AC | PRN
  Administered 2019-04-30: 100 mL via INTRAVENOUS

## 2019-04-30 MED ORDER — ONDANSETRON HCL 4 MG/2ML IJ SOLN
4.0000 mg | Freq: Once | INTRAMUSCULAR | Status: AC
  Administered 2019-04-30: 4 mg via INTRAVENOUS
  Filled 2019-04-30: qty 2

## 2019-04-30 NOTE — ED Notes (Signed)
Report given to [employer]

## 2019-04-30 NOTE — ED Notes (Signed)
Placed pt on tele

## 2019-04-30 NOTE — ED Provider Notes (Signed)
  Physical Exam  BP (!) 181/96   Pulse 85   Temp 98.1 F (36.7 C) (Oral)   Resp 20   Ht 5\' 10"  (1.778 m)   Wt 79.4 kg   SpO2 99%   BMI 25.11 kg/m   Physical Exam  ED Course/Procedures   Clinical Course as of Apr 29 1826  Sat Apr 30, 2019  1043 Patient bradycardic on my evaluation, however came back up into the 60s.  Patient then found to be in ventricular bigeminy.  Magnesium and phosphorus added, will continue to monitor.   [AL]  1531 Updated mother, Abdikarim Dodge. Phone number [phone]   [AL]    Clinical Course User Index [AL] Emi Holes, PA-C    Procedures  MDM   JAYTON RAUTH is a 43 y.o. male with history of hypertension, recent jaw fracture with jaw wired shut for the past 5 weeks who presents with severe right-sided abdominal pain with nausea, vomiting, diarrhea that began 2 to 3 hours PTA.  CT abdomen negative. Patient reports using ETOH and cocaine yesterday and is a daily marijuana user.  Is hypertensive and diaphoretic her. Concern for possible dissection vs hyperemesis syndrome.    Patient given ativan. Undergoing CT dissection study.Reassess after.   Patient reassessed multiple times on my shift.  He is sleeping comfortably.  No complaints at this time. Endorses resolution of his abdominal pain and N/V.   BP has improved but remains elevated. Patient endorses taking HCTZ for his BP, but has not seen a PCP in "a while". Will provide information in discharge paperwork. Patient counseled on quitting marijuana and cocaine.  Patient does not appear to be withdrawing. CT dissection negative. Patient likely suffering from hyperemesis syndrome.  Patient endorses that he will follow up with his PCP. Patient discharged home in stable condition.       Dicky Doe, MD 04/30/19 Mila Homer    Cathren Laine, MD 04/30/19 2308

## 2019-04-30 NOTE — Progress Notes (Signed)
  Evaluation after Contrast Extravasation  Patient seen and examined immediately after contrast extravasation while in CT room 2.  Exam: There is noticeable swelling of the right arm at the area just below and laterally to the [facility] fossa There is no erythema. There is no discoloration. There are no blisters. There are is signs of decreased perfusion of the skin.  It is warm to touch.  The patient has good ROM in fingers.  Radial pulse is normal.  Per contrast extravasation protocol, I have instructed the patient to keep an ice pack on the area for 20-60 minutes at a time for about 48 hours.   Keep arm elevated as much as possible.   The patient understands to call the radiology department if there is: - increase in pain or swelling - changed or altered sensation - ulceration or blistering - increasing redness - warmth or increasing firmness - decreased tissue perfusion as noted by decreased capillary refill or discoloration of skin - decreased pulses peripheral to site   [facility] S BLAIR PA-C 04/30/2019 2:14 PM

## 2019-04-30 NOTE — ED Notes (Signed)
Pt returned back from CT with inflitrated IV with CT contrast below IV site and swelling. Ice pack applied by CT. EDP aware.

## 2019-04-30 NOTE — ED Notes (Signed)
Notified CT Pt was ready with an appropriate IV for angio

## 2019-04-30 NOTE — ED Notes (Signed)
Gave pt water for Po challenge. Pt states he is nauseous but not vomiting at this time

## 2019-04-30 NOTE — ED Notes (Signed)
Pt calling mother to give update

## 2019-04-30 NOTE — ED Notes (Signed)
Pt mom Alona Bene [phone]

## 2019-04-30 NOTE — ED Notes (Signed)
IV started by this RN in CT after hold IV pulled out in CT. Flushed IV with 20 CC saline prior to CT contrast given without any signs of infiltration.
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
## 2019-04-30 NOTE — ED Notes (Addendum)
Patient walking out door with IV. Stopped him, asked if he was leaving, he stated yes. I asked patient to return to room, so I could remove IV.

## 2019-04-30 NOTE — ED Notes (Signed)
Pt had an episode of emesis, pt is now asleep again

## 2019-04-30 NOTE — ED Triage Notes (Signed)
Pt here for evaluation of generalized abdominal pain, N/V that started 2-3 hours ago. Endorses 7 episodes of vomiting.

## 2019-04-30 NOTE — ED Notes (Signed)
Patient transported to CT 

## 2019-04-30 NOTE — ED Notes (Signed)
Spoke with pt about needing another IV in order to do CT scan. Pt agreed. Put in for IV team consult.

## 2019-04-30 NOTE — ED Notes (Signed)
Pt mother called asking "if she needs to come up here to the desk to get an update" states that she has called multiple times and not getting any answers. I have transferred her to the pt room several times but she is requesting a call back from the nurse

## 2019-04-30 NOTE — ED Notes (Signed)
Patient states okay for his mother to get any updates from the provider - EDP aware.

## 2019-04-30 NOTE — ED Notes (Signed)
Pt left before discharge paper work could be discussed. This RN had explained we would be right in. Pt did not listen

## 2019-05-01 ENCOUNTER — Emergency Department (HOSPITAL_COMMUNITY)
Admission: EM | Admit: 2019-05-01 | Discharge: 2019-05-01 | Disposition: A | Discharge status: Home / Self Care | Payer: Self-pay | Attending: Emergency Medicine | Admitting: Emergency Medicine

## 2019-05-01 ENCOUNTER — Encounter (HOSPITAL_COMMUNITY): Payer: Self-pay | Admitting: Emergency Medicine

## 2019-05-01 ENCOUNTER — Other Ambulatory Visit: Payer: Self-pay

## 2019-05-01 DIAGNOSIS — F17210 Nicotine dependence, cigarettes, uncomplicated: Secondary | ICD-10-CM | POA: Insufficient documentation

## 2019-05-01 DIAGNOSIS — I10 Essential (primary) hypertension: Secondary | ICD-10-CM | POA: Insufficient documentation

## 2019-05-01 DIAGNOSIS — S02609G Fracture of mandible, unspecified, subsequent encounter for fracture with delayed healing: Secondary | ICD-10-CM | POA: Insufficient documentation

## 2019-05-01 DIAGNOSIS — X58XXXD Exposure to other specified factors, subsequent encounter: Secondary | ICD-10-CM | POA: Insufficient documentation

## 2019-05-01 DIAGNOSIS — Z4889 Encounter for other specified surgical aftercare: Secondary | ICD-10-CM | POA: Insufficient documentation

## 2019-05-01 DIAGNOSIS — Z79899 Other long term (current) drug therapy: Secondary | ICD-10-CM | POA: Insufficient documentation
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
## 2019-05-01 NOTE — Discharge Instructions (Addendum)
We saw in the ER for fracture of your jaw.  Seems like the hardware is coming off.  We spoke with Dr. Pollyann Kennedy, who would want you to see you in the clinic on Monday.  Please call the number provided.

## 2019-05-01 NOTE — ED Notes (Addendum)
Patient out of his room, he states he has been discharged and would be back on Monday. When asked if he wants his d/c document, he states "you can keep it."
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
## 2019-05-09 ENCOUNTER — Inpatient Hospital Stay (HOSPITAL_COMMUNITY): Admission: RE | Admit: 2019-05-09 | Payer: Self-pay | Source: Ambulatory Visit

## 2019-05-10 ENCOUNTER — Other Ambulatory Visit: Payer: Self-pay

## 2019-05-10 ENCOUNTER — Other Ambulatory Visit (HOSPITAL_COMMUNITY)
Admission: RE | Admit: 2019-05-10 | Discharge: 2019-05-10 | Disposition: A | Discharge status: Home / Self Care | Payer: HRSA Program | Source: Ambulatory Visit | Attending: Otolaryngology | Admitting: Otolaryngology

## 2019-05-10 ENCOUNTER — Encounter (HOSPITAL_COMMUNITY): Payer: Self-pay | Admitting: *Deleted

## 2019-05-10 DIAGNOSIS — Z01812 Encounter for preprocedural laboratory examination: Secondary | ICD-10-CM | POA: Insufficient documentation

## 2019-05-10 DIAGNOSIS — Z1159 Encounter for screening for other viral diseases: Secondary | ICD-10-CM | POA: Diagnosis not present

## 2019-05-10 LAB — SARS CORONAVIRUS 2 BY RT PCR (HOSPITAL ORDER, PERFORMED IN ~~LOC~~ HOSPITAL LAB): SARS Coronavirus 2: NEGATIVE

## 2019-05-11 ENCOUNTER — Ambulatory Visit (HOSPITAL_COMMUNITY)
Admission: RE | Admit: 2019-05-11 | Discharge: 2019-05-11 | Disposition: A | Discharge status: Home / Self Care | Payer: Self-pay | Attending: Otolaryngology | Admitting: Otolaryngology

## 2019-05-11 ENCOUNTER — Other Ambulatory Visit: Payer: Self-pay

## 2019-05-11 ENCOUNTER — Ambulatory Visit (HOSPITAL_COMMUNITY): Payer: Self-pay | Admitting: Certified Registered Nurse Anesthetist

## 2019-05-11 ENCOUNTER — Encounter (HOSPITAL_COMMUNITY)
Admission: RE | Disposition: A | Discharge status: Home / Self Care | Payer: Self-pay | Source: Home / Self Care | Attending: Otolaryngology

## 2019-05-11 ENCOUNTER — Encounter (HOSPITAL_COMMUNITY): Payer: Self-pay

## 2019-05-11 DIAGNOSIS — Z4689 Encounter for fitting and adjustment of other specified devices: Secondary | ICD-10-CM | POA: Insufficient documentation

## 2019-05-11 DIAGNOSIS — F17210 Nicotine dependence, cigarettes, uncomplicated: Secondary | ICD-10-CM | POA: Insufficient documentation

## 2019-05-11 DIAGNOSIS — I708 Atherosclerosis of other arteries: Secondary | ICD-10-CM | POA: Insufficient documentation

## 2019-05-11 DIAGNOSIS — Z8249 Family history of ischemic heart disease and other diseases of the circulatory system: Secondary | ICD-10-CM | POA: Insufficient documentation

## 2019-05-11 DIAGNOSIS — I10 Essential (primary) hypertension: Secondary | ICD-10-CM | POA: Insufficient documentation

## 2019-05-11 HISTORY — PX: MANDIBULAR HARDWARE REMOVAL: SHX5205

## 2019-05-11 SURGERY — REMOVAL, HARDWARE, MANDIBLE
Anesthesia: General | Site: Mouth

## 2019-05-11 MED ORDER — FENTANYL CITRATE (PF) 100 MCG/2ML IJ SOLN
INTRAMUSCULAR | Status: DC | PRN
  Administered 2019-05-11: 50 ug via INTRAVENOUS
  Administered 2019-05-11: 25 ug via INTRAVENOUS
  Administered 2019-05-11: 100 ug via INTRAVENOUS

## 2019-05-11 MED ORDER — LIDOCAINE 2% (20 MG/ML) 5 ML SYRINGE
INTRAMUSCULAR | Status: AC
  Filled 2019-05-11: qty 5

## 2019-05-11 MED ORDER — MIDAZOLAM HCL 2 MG/2ML IJ SOLN
INTRAMUSCULAR | Status: AC
  Filled 2019-05-11: qty 2

## 2019-05-11 MED ORDER — DEXAMETHASONE SODIUM PHOSPHATE 10 MG/ML IJ SOLN
INTRAMUSCULAR | Status: DC | PRN
  Administered 2019-05-11: 10 mg via INTRAVENOUS

## 2019-05-11 MED ORDER — HYDROMORPHONE HCL 1 MG/ML IJ SOLN: 0.2500 mg | INTRAMUSCULAR | Status: DC | PRN

## 2019-05-11 MED ORDER — SUCCINYLCHOLINE CHLORIDE 20 MG/ML IJ SOLN
INTRAMUSCULAR | Status: DC | PRN
  Administered 2019-05-11: 140 mg via INTRAVENOUS

## 2019-05-11 MED ORDER — PROPOFOL 10 MG/ML IV BOLUS
INTRAVENOUS | Status: AC
  Filled 2019-05-11: qty 20

## 2019-05-11 MED ORDER — LACTATED RINGERS IV SOLN
INTRAVENOUS | Status: DC
  Administered 2019-05-11: 08:00:00 via INTRAVENOUS

## 2019-05-11 MED ORDER — LIDOCAINE HCL (CARDIAC) PF 100 MG/5ML IV SOSY
PREFILLED_SYRINGE | INTRAVENOUS | Status: DC | PRN
  Administered 2019-05-11: 100 mg via INTRAVENOUS

## 2019-05-11 MED ORDER — ONDANSETRON HCL 4 MG/2ML IJ SOLN
INTRAMUSCULAR | Status: DC | PRN
  Administered 2019-05-11: 4 mg via INTRAVENOUS

## 2019-05-11 MED ORDER — 0.9 % SODIUM CHLORIDE (POUR BTL) OPTIME
TOPICAL | Status: DC | PRN
  Administered 2019-05-11: 1000 mL

## 2019-05-11 MED ORDER — LIDOCAINE-EPINEPHRINE 1 %-1:100000 IJ SOLN
INTRAMUSCULAR | Status: DC | PRN
  Administered 2019-05-11: 3 mL

## 2019-05-11 MED ORDER — ONDANSETRON HCL 4 MG/2ML IJ SOLN
INTRAMUSCULAR | Status: AC
  Filled 2019-05-11: qty 2

## 2019-05-11 MED ORDER — MEPERIDINE HCL 25 MG/ML IJ SOLN: 6.2500 mg | INTRAMUSCULAR | Status: DC | PRN

## 2019-05-11 MED ORDER — SUCCINYLCHOLINE CHLORIDE 200 MG/10ML IV SOSY
PREFILLED_SYRINGE | INTRAVENOUS | Status: AC
  Filled 2019-05-11: qty 10

## 2019-05-11 MED ORDER — PROPOFOL 10 MG/ML IV BOLUS
INTRAVENOUS | Status: DC | PRN
  Administered 2019-05-11: 200 mg via INTRAVENOUS

## 2019-05-11 MED ORDER — LIDOCAINE-EPINEPHRINE 1 %-1:100000 IJ SOLN
INTRAMUSCULAR | Status: AC
  Filled 2019-05-11: qty 1

## 2019-05-11 MED ORDER — MIDAZOLAM HCL 5 MG/5ML IJ SOLN
INTRAMUSCULAR | Status: DC | PRN
  Administered 2019-05-11: 2 mg via INTRAVENOUS

## 2019-05-11 MED ORDER — ONDANSETRON HCL 4 MG/2ML IJ SOLN: 4.0000 mg | Freq: Once | INTRAMUSCULAR | Status: DC | PRN

## 2019-05-11 MED ORDER — HYDROCODONE-ACETAMINOPHEN 7.5-325 MG PO TABS: 1.0000 | ORAL_TABLET | Freq: Four times a day (QID) | ORAL | 0 refills | Status: DC | PRN

## 2019-05-11 MED ORDER — BACITRACIN-NEOMYCIN-POLYMYXIN 400-5-5000 EX OINT
TOPICAL_OINTMENT | CUTANEOUS | Status: AC
  Filled 2019-05-11: qty 1

## 2019-05-11 MED ORDER — FENTANYL CITRATE (PF) 250 MCG/5ML IJ SOLN
INTRAMUSCULAR | Status: AC
  Filled 2019-05-11: qty 5

## 2019-05-11 MED ORDER — DEXMEDETOMIDINE HCL 200 MCG/2ML IV SOLN
INTRAVENOUS | Status: DC | PRN
  Administered 2019-05-11 (×2): 20 ug via INTRAVENOUS

## 2019-05-11 SURGICAL SUPPLY — 29 items
BLADE SURG 15 STRL LF DISP TIS (BLADE) IMPLANT
BLADE SURG 15 STRL SS (BLADE)
CANISTER SUCT 3000ML PPV (MISCELLANEOUS) ×3 IMPLANT
COVER BACK TABLE 60X90IN (DRAPES) ×3 IMPLANT
COVER SURGICAL LIGHT HANDLE (MISCELLANEOUS) ×3 IMPLANT
COVER WAND RF STERILE (DRAPES) ×3 IMPLANT
DRAPE HALF SHEET 40X57 (DRAPES) ×3 IMPLANT
ELECT COATED BLADE 2.86 ST (ELECTRODE) IMPLANT
ELECT REM PT RETURN 9FT ADLT (ELECTROSURGICAL)
ELECTRODE REM PT RTRN 9FT ADLT (ELECTROSURGICAL) IMPLANT
GAUZE 4X4 16PLY RFD (DISPOSABLE) ×3 IMPLANT
GLOVE ECLIPSE 7.5 STRL STRAW (GLOVE) ×3 IMPLANT
GOWN STRL REUS W/ TWL LRG LVL3 (GOWN DISPOSABLE) ×2 IMPLANT
GOWN STRL REUS W/TWL LRG LVL3 (GOWN DISPOSABLE) ×6
KIT BASIN OR (CUSTOM PROCEDURE TRAY) ×3 IMPLANT
KIT TURNOVER KIT B (KITS) ×3 IMPLANT
NEEDLE PRECISIONGLIDE 27X1.5 (NEEDLE) ×3 IMPLANT
NS IRRIG 1000ML POUR BTL (IV SOLUTION) ×3 IMPLANT
PAD ARMBOARD 7.5X6 YLW CONV (MISCELLANEOUS) ×3 IMPLANT
PENCIL FOOT CONTROL (ELECTRODE) IMPLANT
SUT CHROMIC 3 0 PS 2 (SUTURE) IMPLANT
SUT CHROMIC 4 0 PS 2 18 (SUTURE) IMPLANT
SUT VICRYL 4-0 PS2 18IN ABS (SUTURE) IMPLANT
SYR BULB 3OZ (MISCELLANEOUS) ×3 IMPLANT
SYR CONTROL 10ML LL (SYRINGE) ×3 IMPLANT
TOWEL OR 17X24 6PK STRL BLUE (TOWEL DISPOSABLE) ×3 IMPLANT
TUBE CONNECTING 12'X1/4 (SUCTIONS) ×1
TUBE CONNECTING 12X1/4 (SUCTIONS) ×2 IMPLANT
YANKAUER SUCT BULB TIP NO VENT (SUCTIONS) ×3 IMPLANT

## 2019-05-11 NOTE — Anesthesia Procedure Notes (Signed)
Procedure Name: Intubation Date/Time: 05/11/2019 10:24 AM Performed by: Marlisa Caridi T, CRNA Pre-anesthesia Checklist: Patient identified, Emergency Drugs available, Suction available and Patient being monitored Patient Re-evaluated:Patient Re-evaluated prior to induction Oxygen Delivery Method: Circle system utilized Preoxygenation: Pre-oxygenation with 100% oxygen Induction Type: IV induction and Rapid sequence Laryngoscope Size: Miller and 3 Grade View: Grade I Tube type: Oral Tube size: 7.5 mm Number of attempts: 1 Airway Equipment and Method: Patient positioned with wedge pillow and Stylet Placement Confirmation: ETT inserted through vocal cords under direct vision,  positive ETCO2 and breath sounds checked- equal and bilateral Secured at: 23 cm Tube secured with: Tape Dental Injury: Teeth and Oropharynx as per pre-operative assessment

## 2019-05-11 NOTE — Discharge Instructions (Signed)
Soft diet for one week then resume normal diet.

## 2019-05-11 NOTE — Interval H&P Note (Signed)
History and Physical Interval Note:  05/11/2019 9:46 AM  Raymond Ford  has presented today for surgery, with the diagnosis of jaw fracture.  The various methods of treatment have been discussed with the patient and family. After consideration of risks, benefits and other options for treatment, the patient has consented to  Procedure(s): HARDWARE REMOVAL (N/A) as a surgical intervention.  The patient's history has been reviewed, patient examined, no change in status, stable for surgery.  I have reviewed the patient's chart and labs.  Questions were answered to the patient's satisfaction.     Izora Gala

## 2019-05-11 NOTE — Transfer of Care (Signed)
Immediate Anesthesia Transfer of Care Note  Patient: Raymond Ford  Procedure(s) Performed: HARDWARE REMOVAL (Arch bar removal) (N/A Mouth)  Patient Location: PACU  Anesthesia Type:General  Level of Consciousness: drowsy  Airway & Oxygen Therapy: Patient Spontanous Breathing and Patient connected to nasal cannula oxygen  Post-op Assessment: Report given to RN, Post -op Vital signs reviewed and stable and Patient moving all extremities  Post vital signs: Reviewed and stable  Last Vitals:  Vitals Value Taken Time  BP 126/86 05/11/2019 11:01 AM  Temp 36.2 C 05/11/2019 11:00 AM  Pulse 63 05/11/2019 11:06 AM  Resp 18 05/11/2019 11:06 AM  SpO2 98 % 05/11/2019 11:06 AM  Vitals shown include unvalidated device data.  Last Pain:  Vitals:   05/11/19 1100  TempSrc:   PainSc: Asleep      Patients Stated Pain Goal: 2 (50/03/70 4888)  Complications: No apparent anesthesia complications

## 2019-05-11 NOTE — Anesthesia Postprocedure Evaluation (Signed)
Anesthesia Post Note  Patient: Raymond Ford  Procedure(s) Performed: HARDWARE REMOVAL (Arch bar removal) (N/A Mouth)     Patient location during evaluation: PACU Anesthesia Type: General Level of consciousness: awake and alert Pain management: pain level controlled Vital Signs Assessment: post-procedure vital signs reviewed and stable Respiratory status: spontaneous breathing, nonlabored ventilation, respiratory function stable and patient connected to nasal cannula oxygen Cardiovascular status: blood pressure returned to baseline and stable Postop Assessment: no apparent nausea or vomiting Anesthetic complications: no    Last Vitals:  Vitals:   05/11/19 1125 05/11/19 1138  BP: (!) 128/98 (!) 111/95  Pulse: (!) 51 60  Resp: 18   Temp: (!) 36.4 C   SpO2: 98% 99%    Last Pain:  Vitals:   05/11/19 1138  TempSrc:   PainSc: 0-No pain                 Yoshi Vicencio DAVID

## 2019-05-11 NOTE — Op Note (Signed)
OPERATIVE REPORT  DATE OF SURGERY: 05/11/2019  PATIENT:  Raymond Ford,  43 y.o. male  PRE-OPERATIVE DIAGNOSIS:  jaw fracture  POST-OPERATIVE DIAGNOSIS:  jaw fracture  PROCEDURE:  Procedure(s): HARDWARE REMOVAL (Arch bar removal)  SURGEON:  Beckie Salts, MD  ASSISTANTS: none  ANESTHESIA:   General   EBL:  30 ml  DRAINS: none  LOCAL MEDICATIONS USED:  1% Xylocaine with epinephrine  SPECIMEN:  none  COUNTS:  Correct  PROCEDURE DETAILS: The patient was taken to the operating room and placed on the operating table in the supine position. The MMF wires were cut and removed. Following induction of general endotracheal anesthesia, local anesthetic was infiltrated along the upper and lower gingival mucosa. All of the screws were identified, uncovered by mucosal overgrowth and removed with screwdriver. The upper and lower arch bar were then completely removed. The oral cavity was irrigated with saline and suctioned. The patient was awakened from sedation and transferred to recovery in stable condition.    PATIENT DISPOSITION:  To PACU, stable

## 2019-05-11 NOTE — Anesthesia Preprocedure Evaluation (Signed)
Anesthesia Evaluation  Patient identified by MRN, date of birth, ID band Patient awake    Reviewed: Allergy & Precautions, NPO status , Patient's Chart, lab work & pertinent test results  Airway Mallampati: I  TM Distance: >3 FB Neck ROM: Full    Dental   Pulmonary Current Smoker,    Pulmonary exam normal        Cardiovascular hypertension, Pt. on medications Normal cardiovascular exam     Neuro/Psych    GI/Hepatic   Endo/Other    Renal/GU      Musculoskeletal   Abdominal   Peds  Hematology   Anesthesia Other Findings   Reproductive/Obstetrics                             Anesthesia Physical Anesthesia Plan  ASA: II  Anesthesia Plan: General   Post-op Pain Management:    Induction: Intravenous  PONV Risk Score and Plan: 1 and Treatment may vary due to age or medical condition, Ondansetron, Dexamethasone and Midazolam  Airway Management Planned: Oral ETT  Additional Equipment:   Intra-op Plan:   Post-operative Plan: Extubation in OR  Informed Consent: I have reviewed the patients History and Physical, chart, labs and discussed the procedure including the risks, benefits and alternatives for the proposed anesthesia with the patient or authorized representative who has indicated his/her understanding and acceptance.       Plan Discussed with: CRNA and Surgeon  Anesthesia Plan Comments:         Anesthesia Quick Evaluation

## 2019-05-12 ENCOUNTER — Encounter (HOSPITAL_COMMUNITY): Payer: Self-pay | Admitting: Otolaryngology

## 2019-11-09 ENCOUNTER — Other Ambulatory Visit: Payer: Self-pay

## 2019-11-09 ENCOUNTER — Encounter (HOSPITAL_COMMUNITY): Payer: Self-pay

## 2019-11-09 ENCOUNTER — Emergency Department (HOSPITAL_COMMUNITY)
Admission: EM | Admit: 2019-11-09 | Discharge: 2019-11-09 | Disposition: A | Discharge status: Home / Self Care | Payer: Self-pay | Attending: Emergency Medicine | Admitting: Emergency Medicine

## 2019-11-09 ENCOUNTER — Emergency Department (HOSPITAL_COMMUNITY): Payer: Self-pay

## 2019-11-09 DIAGNOSIS — R079 Chest pain, unspecified: Secondary | ICD-10-CM | POA: Insufficient documentation

## 2019-11-09 DIAGNOSIS — Z5321 Procedure and treatment not carried out due to patient leaving prior to being seen by health care provider: Secondary | ICD-10-CM | POA: Insufficient documentation

## 2019-11-09 LAB — CBC
HCT: 49.2 % (ref 39.0–52.0)
Hemoglobin: 16.3 g/dL (ref 13.0–17.0)
MCH: 31.7 pg (ref 26.0–34.0)
MCHC: 33.1 g/dL (ref 30.0–36.0)
MCV: 95.5 fL (ref 80.0–100.0)
Platelets: 318 10*3/uL (ref 150–400)
RBC: 5.15 MIL/uL (ref 4.22–5.81)
RDW: 11.9 % (ref 11.5–15.5)
WBC: 10 10*3/uL (ref 4.0–10.5)
nRBC: 0 % (ref 0.0–0.2)

## 2019-11-09 LAB — BASIC METABOLIC PANEL
Anion gap: 8 (ref 5–15)
BUN: 10 mg/dL (ref 6–20)
CO2: 28 mmol/L (ref 22–32)
Calcium: 9.9 mg/dL (ref 8.9–10.3)
Chloride: 105 mmol/L (ref 98–111)
Creatinine, Ser: 1.23 mg/dL (ref 0.61–1.24)
GFR calc Af Amer: >60 mL/min (ref >60)
GFR calc non Af Amer: >60 mL/min (ref >60)
Glucose, Bld: 96 mg/dL (ref 70–99)
Potassium: 5.7 mmol/L — ABNORMAL HIGH (ref 3.5–5.1)
Sodium: 141 mmol/L (ref 135–145)

## 2019-11-09 LAB — TROPONIN I (HIGH SENSITIVITY): Troponin I (High Sensitivity): 4 ng/L (ref <18)

## 2019-11-09 NOTE — ED Provider Notes (Signed)
Patient left after triage prior to being seen by myself.   Wyvonnia Dusky, MD 11/09/19 [phone]

## 2019-11-09 NOTE — ED Triage Notes (Signed)
Pt arrives POV for eval of chest pain x 1 week and cough x1.5 months. Pt reports centralized, non radiating CP onset last week. Denies SOB/N/V.

## 2019-12-17 ENCOUNTER — Emergency Department (HOSPITAL_COMMUNITY): Admission: EM | Admit: 2019-12-17 | Discharge: 2019-12-18 | Discharge status: Against Medical Advice | Payer: Self-pay

## 2019-12-17 ENCOUNTER — Other Ambulatory Visit: Payer: Self-pay

## 2019-12-17 NOTE — ED Triage Notes (Signed)
Pt left before he could be checked in  He threw his labels in the trash can and left
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
## 2019-12-19 ENCOUNTER — Other Ambulatory Visit: Payer: Self-pay

## 2019-12-19 ENCOUNTER — Ambulatory Visit (HOSPITAL_COMMUNITY)
Admission: EM | Admit: 2019-12-19 | Discharge: 2019-12-19 | Disposition: A | Discharge status: Home / Self Care | Payer: Self-pay | Attending: Family Medicine | Admitting: Family Medicine

## 2019-12-19 ENCOUNTER — Encounter (HOSPITAL_COMMUNITY): Payer: Self-pay | Admitting: Emergency Medicine

## 2019-12-19 DIAGNOSIS — Z20822 Contact with and (suspected) exposure to covid-19: Secondary | ICD-10-CM | POA: Insufficient documentation

## 2019-12-19 DIAGNOSIS — Z79899 Other long term (current) drug therapy: Secondary | ICD-10-CM | POA: Insufficient documentation

## 2019-12-19 DIAGNOSIS — R1013 Epigastric pain: Secondary | ICD-10-CM | POA: Insufficient documentation

## 2019-12-19 DIAGNOSIS — K2920 Alcoholic gastritis without bleeding: Secondary | ICD-10-CM | POA: Insufficient documentation

## 2019-12-19 DIAGNOSIS — I10 Essential (primary) hypertension: Secondary | ICD-10-CM | POA: Insufficient documentation

## 2019-12-19 DIAGNOSIS — F17210 Nicotine dependence, cigarettes, uncomplicated: Secondary | ICD-10-CM | POA: Insufficient documentation

## 2019-12-19 DIAGNOSIS — R6884 Jaw pain: Secondary | ICD-10-CM | POA: Insufficient documentation

## 2019-12-19 DIAGNOSIS — R112 Nausea with vomiting, unspecified: Secondary | ICD-10-CM

## 2019-12-19 LAB — POCT URINALYSIS DIP (DEVICE)
Bilirubin Urine: NEGATIVE
Glucose, UA: NEGATIVE mg/dL
Ketones, ur: NEGATIVE mg/dL
Leukocytes,Ua: NEGATIVE
Nitrite: NEGATIVE
Protein, ur: NEGATIVE mg/dL
Specific Gravity, Urine: 1.025 (ref 1.005–1.030)
Urobilinogen, UA: 0.2 mg/dL (ref 0.0–1.0)
pH: 5.5 (ref 5.0–8.0)

## 2019-12-19 LAB — COMPREHENSIVE METABOLIC PANEL
ALT: 32 U/L (ref 0–44)
AST: 34 U/L (ref 15–41)
Albumin: 4.6 g/dL (ref 3.5–5.0)
Alkaline Phosphatase: 90 U/L (ref 38–126)
Anion gap: 13 (ref 5–15)
BUN: 15 mg/dL (ref 6–20)
CO2: 26 mmol/L (ref 22–32)
Calcium: 9.9 mg/dL (ref 8.9–10.3)
Chloride: 97 mmol/L — ABNORMAL LOW (ref 98–111)
Creatinine, Ser: 1.37 mg/dL — ABNORMAL HIGH (ref 0.61–1.24)
GFR calc Af Amer: >60 mL/min (ref >60)
GFR calc non Af Amer: >60 mL/min (ref >60)
Glucose, Bld: 118 mg/dL — ABNORMAL HIGH (ref 70–99)
Potassium: 3.5 mmol/L (ref 3.5–5.1)
Sodium: 136 mmol/L (ref 135–145)
Total Bilirubin: 1 mg/dL (ref 0.3–1.2)
Total Protein: 8.3 g/dL — ABNORMAL HIGH (ref 6.5–8.1)

## 2019-12-19 LAB — CBC
HCT: 51.4 % (ref 39.0–52.0)
Hemoglobin: 17.6 g/dL — ABNORMAL HIGH (ref 13.0–17.0)
MCH: 31.2 pg (ref 26.0–34.0)
MCHC: 34.2 g/dL (ref 30.0–36.0)
MCV: 91 fL (ref 80.0–100.0)
Platelets: 288 10*3/uL (ref 150–400)
RBC: 5.65 MIL/uL (ref 4.22–5.81)
RDW: 11.4 % — ABNORMAL LOW (ref 11.5–15.5)
WBC: 11.8 10*3/uL — ABNORMAL HIGH (ref 4.0–10.5)
nRBC: 0 % (ref 0.0–0.2)

## 2019-12-19 LAB — LIPASE, BLOOD: Lipase: 27 U/L (ref 11–51)

## 2019-12-19 MED ORDER — ONDANSETRON 4 MG PO TBDP
4.0000 mg | ORAL_TABLET | Freq: Once | ORAL | Status: AC
  Administered 2019-12-19: 14:00:00 4 mg via ORAL

## 2019-12-19 MED ORDER — ONDANSETRON 4 MG PO TBDP: 4.0000 mg | ORAL_TABLET | Freq: Three times a day (TID) | ORAL | 0 refills | Status: DC | PRN

## 2019-12-19 MED ORDER — SODIUM CHLORIDE 0.9 % IV BOLUS
1000.0000 mL | Freq: Once | INTRAVENOUS | Status: AC
  Administered 2019-12-19: 1000 mL via INTRAVENOUS

## 2019-12-19 MED ORDER — ONDANSETRON 4 MG PO TBDP
ORAL_TABLET | ORAL | Status: AC
  Filled 2019-12-19: qty 1

## 2019-12-19 MED ORDER — OMEPRAZOLE 20 MG PO CPDR: 20.0000 mg | DELAYED_RELEASE_CAPSULE | Freq: Two times a day (BID) | ORAL | 0 refills | Status: DC

## 2019-12-19 MED ORDER — DOXYCYCLINE HYCLATE 100 MG PO CAPS: 100.0000 mg | ORAL_CAPSULE | Freq: Two times a day (BID) | ORAL | 0 refills | Status: AC

## 2019-12-19 NOTE — ED Triage Notes (Signed)
PT reports dizziness, headache for 2 days. History of HBP, Has been off of his meds for 1.5 years. Nausea and abdominal pain as well. PT consumed a lot of alcohol 3 days ago, symptoms started next day.   Had jaw surgery 1 year ago and reports continued pain and drainage from surgical site for 5 months

## 2019-12-19 NOTE — Discharge Instructions (Signed)
Please continue to use Zofran every 6-8 hours as needed for nausea and vomiting, dissolves in mouth Begin omeprazole twice daily before meals for the next 2 weeks Slowly transition diet, focus on liquids today, avoid spicy and greasy Please use Tylenol [phone] mg every 4-6 hours for headache, avoid ibuprofen/Aleve temporarily to full avoid further stomach upset Covid swab pending, monitor my chart for results, quarantine until results return  Jaw pain: Begin doxycycline twice daily for the next 10 days, follow-up with surgeon if symptoms persisting.
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
## 2019-12-21 LAB — NOVEL CORONAVIRUS, NAA (HOSP ORDER, SEND-OUT TO REF LAB; TAT 18-24 HRS): SARS-CoV-2, NAA: NOT DETECTED

## 2020-01-20 ENCOUNTER — Emergency Department (HOSPITAL_COMMUNITY): Payer: No Typology Code available for payment source

## 2020-01-20 ENCOUNTER — Inpatient Hospital Stay (HOSPITAL_COMMUNITY): Payer: No Typology Code available for payment source | Admitting: Certified Registered Nurse Anesthetist

## 2020-01-20 ENCOUNTER — Inpatient Hospital Stay (HOSPITAL_COMMUNITY): Payer: No Typology Code available for payment source

## 2020-01-20 ENCOUNTER — Inpatient Hospital Stay (HOSPITAL_COMMUNITY)
Admission: EM | Admit: 2020-01-20 | Discharge: 2020-01-23 | DRG: 481 | Disposition: A | Discharge status: Home / Self Care | Payer: No Typology Code available for payment source | Attending: General Surgery | Admitting: General Surgery

## 2020-01-20 ENCOUNTER — Encounter (HOSPITAL_COMMUNITY): Payer: Self-pay

## 2020-01-20 ENCOUNTER — Encounter (HOSPITAL_COMMUNITY): Admission: EM | Disposition: A | Discharge status: Home / Self Care | Payer: Self-pay | Source: Home / Self Care

## 2020-01-20 DIAGNOSIS — Z23 Encounter for immunization: Secondary | ICD-10-CM

## 2020-01-20 DIAGNOSIS — I959 Hypotension, unspecified: Secondary | ICD-10-CM | POA: Diagnosis present

## 2020-01-20 DIAGNOSIS — Z20822 Contact with and (suspected) exposure to covid-19: Secondary | ICD-10-CM | POA: Diagnosis present

## 2020-01-20 DIAGNOSIS — S72355A Nondisplaced comminuted fracture of shaft of left femur, initial encounter for closed fracture: Secondary | ICD-10-CM

## 2020-01-20 DIAGNOSIS — S72355B Nondisplaced comminuted fracture of shaft of left femur, initial encounter for open fracture type I or II: Principal | ICD-10-CM | POA: Diagnosis present

## 2020-01-20 DIAGNOSIS — Z972 Presence of dental prosthetic device (complete) (partial): Secondary | ICD-10-CM | POA: Diagnosis not present

## 2020-01-20 DIAGNOSIS — S8412XA Injury of peroneal nerve at lower leg level, left leg, initial encounter: Secondary | ICD-10-CM | POA: Diagnosis present

## 2020-01-20 DIAGNOSIS — Z560 Unemployment, unspecified: Secondary | ICD-10-CM | POA: Diagnosis not present

## 2020-01-20 DIAGNOSIS — S72302B Unspecified fracture of shaft of left femur, initial encounter for open fracture type I or II: Secondary | ICD-10-CM | POA: Diagnosis present

## 2020-01-20 DIAGNOSIS — G5732 Lesion of lateral popliteal nerve, left lower limb: Secondary | ICD-10-CM | POA: Diagnosis present

## 2020-01-20 DIAGNOSIS — F17200 Nicotine dependence, unspecified, uncomplicated: Secondary | ICD-10-CM | POA: Diagnosis present

## 2020-01-20 DIAGNOSIS — T148XXA Other injury of unspecified body region, initial encounter: Secondary | ICD-10-CM

## 2020-01-20 DIAGNOSIS — E876 Hypokalemia: Secondary | ICD-10-CM | POA: Diagnosis present

## 2020-01-20 DIAGNOSIS — Y249XXA Unspecified firearm discharge, undetermined intent, initial encounter: Secondary | ICD-10-CM

## 2020-01-20 DIAGNOSIS — W3400XA Accidental discharge from unspecified firearms or gun, initial encounter: Secondary | ICD-10-CM

## 2020-01-20 DIAGNOSIS — D62 Acute posthemorrhagic anemia: Secondary | ICD-10-CM | POA: Diagnosis not present

## 2020-01-20 DIAGNOSIS — Z419 Encounter for procedure for purposes other than remedying health state, unspecified: Secondary | ICD-10-CM

## 2020-01-20 HISTORY — DX: Accidental discharge from unspecified firearms or gun, initial encounter: W34.00XA

## 2020-01-20 HISTORY — PX: FEMUR IM NAIL: SHX1597

## 2020-01-20 LAB — CBC
HCT: 42.9 % (ref 39.0–52.0)
HCT: 41.1 % (ref 39.0–52.0)
HCT: 40.8 % (ref 39.0–52.0)
Hemoglobin: 14.4 g/dL (ref 13.0–17.0)
Hemoglobin: 14.1 g/dL (ref 13.0–17.0)
Hemoglobin: 14.2 g/dL (ref 13.0–17.0)
MCH: 32.2 pg (ref 26.0–34.0)
MCH: 31.3 pg (ref 26.0–34.0)
MCH: 31.7 pg (ref 26.0–34.0)
MCHC: 33.6 g/dL (ref 30.0–36.0)
MCHC: 34.3 g/dL (ref 30.0–36.0)
MCHC: 34.8 g/dL (ref 30.0–36.0)
MCV: 96 fL (ref 80.0–100.0)
MCV: 91.3 fL (ref 80.0–100.0)
MCV: 91.1 fL (ref 80.0–100.0)
Platelets: 312 10*3/uL (ref 150–400)
Platelets: 260 10*3/uL (ref 150–400)
Platelets: 263 10*3/uL (ref 150–400)
RBC: 4.47 MIL/uL (ref 4.22–5.81)
RBC: 4.5 MIL/uL (ref 4.22–5.81)
RBC: 4.48 MIL/uL (ref 4.22–5.81)
RDW: 11.9 % (ref 11.5–15.5)
RDW: 12.3 % (ref 11.5–15.5)
RDW: 12.3 % (ref 11.5–15.5)
WBC: 15.2 10*3/uL — ABNORMAL HIGH (ref 4.0–10.5)
WBC: 20.6 10*3/uL — ABNORMAL HIGH (ref 4.0–10.5)
WBC: 19.3 10*3/uL — ABNORMAL HIGH (ref 4.0–10.5)
nRBC: 0 % (ref 0.0–0.2)
nRBC: 0 % (ref 0.0–0.2)
nRBC: 0 % (ref 0.0–0.2)

## 2020-01-20 LAB — I-STAT CHEM 8, ED
BUN: 13 mg/dL (ref 6–20)
Calcium, Ion: 1.1 mmol/L — ABNORMAL LOW (ref 1.15–1.40)
Chloride: 105 mmol/L (ref 98–111)
Creatinine, Ser: 1.6 mg/dL — ABNORMAL HIGH (ref 0.61–1.24)
Glucose, Bld: 154 mg/dL — ABNORMAL HIGH (ref 70–99)
HCT: 44 % (ref 39.0–52.0)
Hemoglobin: 15 g/dL (ref 13.0–17.0)
Potassium: 3.2 mmol/L — ABNORMAL LOW (ref 3.5–5.1)
Sodium: 139 mmol/L (ref 135–145)
TCO2: 23 mmol/L (ref 22–32)

## 2020-01-20 LAB — COMPREHENSIVE METABOLIC PANEL
ALT: 26 U/L (ref 0–44)
ALT: 40 U/L (ref 0–44)
AST: 27 U/L (ref 15–41)
AST: 64 U/L — ABNORMAL HIGH (ref 15–41)
Albumin: 3.8 g/dL (ref 3.5–5.0)
Albumin: 3.7 g/dL (ref 3.5–5.0)
Alkaline Phosphatase: 83 U/L (ref 38–126)
Alkaline Phosphatase: 69 U/L (ref 38–126)
Anion gap: 16 — ABNORMAL HIGH (ref 5–15)
Anion gap: 11 (ref 5–15)
BUN: 12 mg/dL (ref 6–20)
BUN: 8 mg/dL (ref 6–20)
CO2: 19 mmol/L — ABNORMAL LOW (ref 22–32)
CO2: 21 mmol/L — ABNORMAL LOW (ref 22–32)
Calcium: 8.8 mg/dL — ABNORMAL LOW (ref 8.9–10.3)
Calcium: 8.3 mg/dL — ABNORMAL LOW (ref 8.9–10.3)
Chloride: 102 mmol/L (ref 98–111)
Chloride: 106 mmol/L (ref 98–111)
Creatinine, Ser: 1.38 mg/dL — ABNORMAL HIGH (ref 0.61–1.24)
Creatinine, Ser: 1.17 mg/dL (ref 0.61–1.24)
GFR calc Af Amer: >60 mL/min (ref >60)
GFR calc Af Amer: >60 mL/min (ref >60)
GFR calc non Af Amer: >60 mL/min (ref >60)
GFR calc non Af Amer: >60 mL/min (ref >60)
Glucose, Bld: 163 mg/dL — ABNORMAL HIGH (ref 70–99)
Glucose, Bld: 102 mg/dL — ABNORMAL HIGH (ref 70–99)
Potassium: 3.2 mmol/L — ABNORMAL LOW (ref 3.5–5.1)
Potassium: 4.2 mmol/L (ref 3.5–5.1)
Sodium: 137 mmol/L (ref 135–145)
Sodium: 138 mmol/L (ref 135–145)
Total Bilirubin: 0.6 mg/dL (ref 0.3–1.2)
Total Bilirubin: 0.6 mg/dL (ref 0.3–1.2)
Total Protein: 6.6 g/dL (ref 6.5–8.1)
Total Protein: 6.3 g/dL — ABNORMAL LOW (ref 6.5–8.1)

## 2020-01-20 LAB — URINALYSIS, ROUTINE W REFLEX MICROSCOPIC
Bilirubin Urine: NEGATIVE
Glucose, UA: NEGATIVE mg/dL
Hgb urine dipstick: NEGATIVE
Ketones, ur: NEGATIVE mg/dL
Leukocytes,Ua: NEGATIVE
Nitrite: NEGATIVE
Protein, ur: NEGATIVE mg/dL
Specific Gravity, Urine: 1.019 (ref 1.005–1.030)
pH: 6 (ref 5.0–8.0)

## 2020-01-20 LAB — RESPIRATORY PANEL BY RT PCR (FLU A&B, COVID)
Influenza A by PCR: NEGATIVE
Influenza B by PCR: NEGATIVE
SARS Coronavirus 2 by RT PCR: NEGATIVE

## 2020-01-20 LAB — HIV ANTIBODY (ROUTINE TESTING W REFLEX): HIV Screen 4th Generation wRfx: NONREACTIVE

## 2020-01-20 LAB — ABO/RH: ABO/RH(D): O POS

## 2020-01-20 LAB — PROTIME-INR
INR: 1 (ref 0.8–1.2)
Prothrombin Time: 12.7 seconds (ref 11.4–15.2)

## 2020-01-20 LAB — PHOSPHORUS: Phosphorus: 3.6 mg/dL (ref 2.5–4.6)

## 2020-01-20 LAB — BLOOD PRODUCT ORDER (VERBAL) VERIFICATION

## 2020-01-20 LAB — MAGNESIUM: Magnesium: 1.8 mg/dL (ref 1.7–2.4)

## 2020-01-20 LAB — MRSA PCR SCREENING: MRSA by PCR: NEGATIVE

## 2020-01-20 LAB — CBG MONITORING, ED: Glucose-Capillary: 146 mg/dL — ABNORMAL HIGH (ref 70–99)

## 2020-01-20 LAB — CDS SEROLOGY

## 2020-01-20 LAB — ETHANOL: Alcohol, Ethyl (B): 110 mg/dL — ABNORMAL HIGH (ref <10)

## 2020-01-20 LAB — LACTIC ACID, PLASMA: Lactic Acid, Venous: 4.1 mmol/L (ref 0.5–1.9)

## 2020-01-20 SURGERY — INSERTION, INTRAMEDULLARY ROD, FEMUR
Anesthesia: Choice | Laterality: Left

## 2020-01-20 SURGERY — INSERTION, INTRAMEDULLARY ROD, FEMUR
Anesthesia: General | Laterality: Left

## 2020-01-20 MED ORDER — ONDANSETRON 4 MG PO TBDP: 4.0000 mg | ORAL_TABLET | Freq: Four times a day (QID) | ORAL | Status: DC | PRN

## 2020-01-20 MED ORDER — PROPOFOL 10 MG/ML IV BOLUS
INTRAVENOUS | Status: DC | PRN
  Administered 2020-01-20: 180 mg via INTRAVENOUS

## 2020-01-20 MED ORDER — HYDROMORPHONE HCL 1 MG/ML IJ SOLN
1.0000 mg | Freq: Once | INTRAMUSCULAR | Status: AC
  Administered 2020-01-20: 1 mg via INTRAVENOUS
  Filled 2020-01-20: qty 1

## 2020-01-20 MED ORDER — FENTANYL CITRATE (PF) 100 MCG/2ML IJ SOLN
INTRAMUSCULAR | Status: AC
  Administered 2020-01-20: 50 ug via INTRAVENOUS
  Filled 2020-01-20: qty 2

## 2020-01-20 MED ORDER — METOCLOPRAMIDE HCL 5 MG/ML IJ SOLN: 5.0000 mg | Freq: Three times a day (TID) | INTRAMUSCULAR | Status: DC | PRN

## 2020-01-20 MED ORDER — BISACODYL 10 MG RE SUPP: 10.0000 mg | Freq: Every day | RECTAL | Status: DC | PRN

## 2020-01-20 MED ORDER — HYDROMORPHONE HCL 1 MG/ML IJ SOLN: 0.5000 mg | INTRAMUSCULAR | Status: DC | PRN

## 2020-01-20 MED ORDER — METOPROLOL TARTRATE 5 MG/5ML IV SOLN: 5.0000 mg | Freq: Four times a day (QID) | INTRAVENOUS | Status: DC | PRN

## 2020-01-20 MED ORDER — THIAMINE HCL 100 MG PO TABS
100.0000 mg | ORAL_TABLET | Freq: Every day | ORAL | Status: DC
  Administered 2020-01-20 – 2020-01-23 (×4): 100 mg via ORAL
  Filled 2020-01-20 (×4): qty 1

## 2020-01-20 MED ORDER — LIDOCAINE 2% (20 MG/ML) 5 ML SYRINGE
INTRAMUSCULAR | Status: DC | PRN
  Administered 2020-01-20: 100 mg via INTRAVENOUS

## 2020-01-20 MED ORDER — ROCURONIUM BROMIDE 10 MG/ML (PF) SYRINGE
PREFILLED_SYRINGE | INTRAVENOUS | Status: AC
  Filled 2020-01-20: qty 10

## 2020-01-20 MED ORDER — KETOROLAC TROMETHAMINE 30 MG/ML IJ SOLN
INTRAMUSCULAR | Status: DC | PRN
  Administered 2020-01-20: 30 mg via INTRAVENOUS

## 2020-01-20 MED ORDER — ONDANSETRON HCL 4 MG/2ML IJ SOLN
INTRAMUSCULAR | Status: AC
  Filled 2020-01-20: qty 2

## 2020-01-20 MED ORDER — CEFAZOLIN SODIUM-DEXTROSE 2-4 GM/100ML-% IV SOLN
2.0000 g | Freq: Three times a day (TID) | INTRAVENOUS | Status: AC
  Administered 2020-01-20: 2 g via INTRAVENOUS
  Filled 2020-01-20 (×2): qty 100

## 2020-01-20 MED ORDER — FOLIC ACID 1 MG PO TABS
1.0000 mg | ORAL_TABLET | Freq: Every day | ORAL | Status: DC
  Administered 2020-01-20 – 2020-01-23 (×4): 1 mg via ORAL
  Filled 2020-01-20 (×4): qty 1

## 2020-01-20 MED ORDER — ONDANSETRON HCL 4 MG PO TABS: 4.0000 mg | ORAL_TABLET | Freq: Four times a day (QID) | ORAL | Status: DC | PRN

## 2020-01-20 MED ORDER — ALBUTEROL SULFATE HFA 108 (90 BASE) MCG/ACT IN AERS
INHALATION_SPRAY | RESPIRATORY_TRACT | Status: DC | PRN
  Administered 2020-01-20: 6 via RESPIRATORY_TRACT

## 2020-01-20 MED ORDER — CEFAZOLIN SODIUM-DEXTROSE 1-4 GM/50ML-% IV SOLN: 1.0000 g | Freq: Four times a day (QID) | INTRAVENOUS | Status: DC

## 2020-01-20 MED ORDER — ROCURONIUM BROMIDE 100 MG/10ML IV SOLN
INTRAVENOUS | Status: DC | PRN
  Administered 2020-01-20: 80 mg via INTRAVENOUS
  Administered 2020-01-20: 20 mg via INTRAVENOUS

## 2020-01-20 MED ORDER — CHLORHEXIDINE GLUCONATE 4 % EX LIQD: 60.0000 mL | Freq: Once | CUTANEOUS | Status: DC

## 2020-01-20 MED ORDER — POVIDONE-IODINE 10 % EX SWAB: 2.0000 "application " | Freq: Once | CUTANEOUS | Status: DC

## 2020-01-20 MED ORDER — ENOXAPARIN SODIUM 40 MG/0.4ML ~~LOC~~ SOLN
40.0000 mg | SUBCUTANEOUS | Status: DC
  Administered 2020-01-21 – 2020-01-23 (×3): 40 mg via SUBCUTANEOUS
  Filled 2020-01-20 (×3): qty 0.4

## 2020-01-20 MED ORDER — DOCUSATE SODIUM 100 MG PO CAPS
200.0000 mg | ORAL_CAPSULE | Freq: Two times a day (BID) | ORAL | Status: DC
  Administered 2020-01-21 – 2020-01-22 (×3): 200 mg via ORAL
  Filled 2020-01-20 (×5): qty 2

## 2020-01-20 MED ORDER — KETOROLAC TROMETHAMINE 30 MG/ML IJ SOLN
30.0000 mg | Freq: Four times a day (QID) | INTRAMUSCULAR | Status: DC
  Administered 2020-01-20 – 2020-01-23 (×10): 30 mg via INTRAVENOUS
  Filled 2020-01-20 (×11): qty 1

## 2020-01-20 MED ORDER — LORAZEPAM 2 MG/ML IJ SOLN
1.0000 mg | INTRAMUSCULAR | Status: AC | PRN
  Administered 2020-01-22: 2 mg via INTRAVENOUS
  Filled 2020-01-20: qty 1

## 2020-01-20 MED ORDER — TETANUS-DIPHTH-ACELL PERTUSSIS 5-2.5-18.5 LF-MCG/0.5 IM SUSP
0.5000 mL | Freq: Once | INTRAMUSCULAR | Status: AC
  Administered 2020-01-20: 0.5 mL via INTRAMUSCULAR
  Filled 2020-01-20: qty 0.5

## 2020-01-20 MED ORDER — OXYCODONE HCL 5 MG PO TABS
10.0000 mg | ORAL_TABLET | Freq: Four times a day (QID) | ORAL | Status: DC | PRN
  Administered 2020-01-20 – 2020-01-23 (×10): 10 mg via ORAL
  Filled 2020-01-20 (×10): qty 2

## 2020-01-20 MED ORDER — MIDAZOLAM HCL 2 MG/2ML IJ SOLN
INTRAMUSCULAR | Status: AC
  Filled 2020-01-20: qty 2

## 2020-01-20 MED ORDER — FENTANYL CITRATE (PF) 100 MCG/2ML IJ SOLN
INTRAMUSCULAR | Status: AC
  Filled 2020-01-20: qty 2

## 2020-01-20 MED ORDER — IBUPROFEN 200 MG PO TABS: 600.0000 mg | ORAL_TABLET | Freq: Four times a day (QID) | ORAL | Status: DC | PRN

## 2020-01-20 MED ORDER — FENTANYL CITRATE (PF) 100 MCG/2ML IJ SOLN
INTRAMUSCULAR | Status: AC | PRN
  Administered 2020-01-20: 50 ug via INTRAVENOUS

## 2020-01-20 MED ORDER — CEFAZOLIN SODIUM-DEXTROSE 2-4 GM/100ML-% IV SOLN
2.0000 g | INTRAVENOUS | Status: AC
  Administered 2020-01-20: 2 g via INTRAVENOUS
  Filled 2020-01-20: qty 100

## 2020-01-20 MED ORDER — SODIUM CHLORIDE 0.9 % IV SOLN
INTRAVENOUS | Status: AC | PRN
  Administered 2020-01-20: 1000 mL via INTRAVENOUS

## 2020-01-20 MED ORDER — METOCLOPRAMIDE HCL 10 MG PO TABS: 5.0000 mg | ORAL_TABLET | Freq: Three times a day (TID) | ORAL | Status: DC | PRN

## 2020-01-20 MED ORDER — ACETAMINOPHEN 500 MG PO TABS
1000.0000 mg | ORAL_TABLET | Freq: Four times a day (QID) | ORAL | Status: DC
  Administered 2020-01-20 – 2020-01-23 (×11): 1000 mg via ORAL
  Filled 2020-01-20 (×12): qty 2

## 2020-01-20 MED ORDER — LORAZEPAM 1 MG PO TABS: 1.0000 mg | ORAL_TABLET | ORAL | Status: AC | PRN

## 2020-01-20 MED ORDER — POTASSIUM CHLORIDE CRYS ER 20 MEQ PO TBCR
40.0000 meq | EXTENDED_RELEASE_TABLET | Freq: Three times a day (TID) | ORAL | Status: AC
  Administered 2020-01-20 (×2): 40 meq via ORAL
  Filled 2020-01-20 (×2): qty 2

## 2020-01-20 MED ORDER — LABETALOL HCL 5 MG/ML IV SOLN: 10.0000 mg | INTRAVENOUS | Status: DC | PRN

## 2020-01-20 MED ORDER — ONDANSETRON HCL 4 MG/2ML IJ SOLN: 4.0000 mg | Freq: Four times a day (QID) | INTRAMUSCULAR | Status: DC | PRN

## 2020-01-20 MED ORDER — IOHEXOL 350 MG/ML SOLN
100.0000 mL | Freq: Once | INTRAVENOUS | Status: AC | PRN
  Administered 2020-01-20: 100 mL via INTRAVENOUS

## 2020-01-20 MED ORDER — DEXAMETHASONE SODIUM PHOSPHATE 10 MG/ML IJ SOLN
INTRAMUSCULAR | Status: DC | PRN
  Administered 2020-01-20: 10 mg via INTRAVENOUS

## 2020-01-20 MED ORDER — 0.9 % SODIUM CHLORIDE (POUR BTL) OPTIME
TOPICAL | Status: DC | PRN
  Administered 2020-01-20: 12:00:00 1000 mL

## 2020-01-20 MED ORDER — ONDANSETRON HCL 4 MG/2ML IJ SOLN
4.0000 mg | Freq: Four times a day (QID) | INTRAMUSCULAR | Status: DC | PRN
  Administered 2020-01-20: 4 mg via INTRAVENOUS
  Filled 2020-01-20: qty 2

## 2020-01-20 MED ORDER — TRANEXAMIC ACID-NACL 1000-0.7 MG/100ML-% IV SOLN
1000.0000 mg | Freq: Once | INTRAVENOUS | Status: AC
  Administered 2020-01-20: 1000 mg via INTRAVENOUS
  Filled 2020-01-20 (×2): qty 100

## 2020-01-20 MED ORDER — PROPOFOL 10 MG/ML IV BOLUS
INTRAVENOUS | Status: AC
  Filled 2020-01-20: qty 20

## 2020-01-20 MED ORDER — FENTANYL CITRATE (PF) 250 MCG/5ML IJ SOLN
INTRAMUSCULAR | Status: AC
  Filled 2020-01-20: qty 5

## 2020-01-20 MED ORDER — CEFAZOLIN SODIUM-DEXTROSE 2-4 GM/100ML-% IV SOLN
2.0000 g | Freq: Once | INTRAVENOUS | Status: AC
  Administered 2020-01-20: 2 g via INTRAVENOUS
  Filled 2020-01-20: qty 100

## 2020-01-20 MED ORDER — ONDANSETRON HCL 4 MG/2ML IJ SOLN
INTRAMUSCULAR | Status: DC | PRN
  Administered 2020-01-20: 4 mg via INTRAVENOUS

## 2020-01-20 MED ORDER — KETAMINE HCL 100 MG/ML IJ SOLN
INTRAMUSCULAR | Status: AC
  Filled 2020-01-20: qty 1

## 2020-01-20 MED ORDER — LIDOCAINE 2% (20 MG/ML) 5 ML SYRINGE
INTRAMUSCULAR | Status: AC
  Filled 2020-01-20: qty 5

## 2020-01-20 MED ORDER — KETAMINE HCL 10 MG/ML IJ SOLN
INTRAMUSCULAR | Status: DC | PRN
  Administered 2020-01-20: 30 mg via INTRAVENOUS

## 2020-01-20 MED ORDER — DOCUSATE SODIUM 100 MG PO CAPS: 100.0000 mg | ORAL_CAPSULE | Freq: Two times a day (BID) | ORAL | Status: DC

## 2020-01-20 MED ORDER — FENTANYL CITRATE (PF) 100 MCG/2ML IJ SOLN
INTRAMUSCULAR | Status: DC | PRN
  Administered 2020-01-20: 50 ug via INTRAVENOUS
  Administered 2020-01-20: 100 ug via INTRAVENOUS
  Administered 2020-01-20 (×2): 50 ug via INTRAVENOUS

## 2020-01-20 MED ORDER — LACTATED RINGERS IV SOLN: INTRAVENOUS | Status: DC

## 2020-01-20 MED ORDER — KETAMINE HCL 50 MG/5ML IJ SOSY
PREFILLED_SYRINGE | INTRAMUSCULAR | Status: AC
  Filled 2020-01-20: qty 5

## 2020-01-20 MED ORDER — THIAMINE HCL 100 MG/ML IJ SOLN: 100.0000 mg | Freq: Every day | INTRAMUSCULAR | Status: DC

## 2020-01-20 MED ORDER — LABETALOL HCL 5 MG/ML IV SOLN
10.0000 mg | INTRAVENOUS | Status: DC | PRN
  Administered 2020-01-20 (×2): 10 mg via INTRAVENOUS
  Filled 2020-01-20: qty 4

## 2020-01-20 MED ORDER — DEXAMETHASONE SODIUM PHOSPHATE 10 MG/ML IJ SOLN
INTRAMUSCULAR | Status: AC
  Filled 2020-01-20: qty 1

## 2020-01-20 MED ORDER — ADULT MULTIVITAMIN W/MINERALS CH
1.0000 | ORAL_TABLET | Freq: Every day | ORAL | Status: DC
  Administered 2020-01-20 – 2020-01-23 (×4): 1 via ORAL
  Filled 2020-01-20 (×4): qty 1

## 2020-01-20 MED ORDER — SUGAMMADEX SODIUM 200 MG/2ML IV SOLN
INTRAVENOUS | Status: DC | PRN
  Administered 2020-01-20: 200 mg via INTRAVENOUS

## 2020-01-20 MED ORDER — MIDAZOLAM HCL 5 MG/5ML IJ SOLN
INTRAMUSCULAR | Status: DC | PRN
  Administered 2020-01-20: 2 mg via INTRAVENOUS

## 2020-01-20 MED ORDER — KETOROLAC TROMETHAMINE 30 MG/ML IJ SOLN
INTRAMUSCULAR | Status: AC
  Filled 2020-01-20: qty 1

## 2020-01-20 MED ORDER — PROMETHAZINE HCL 25 MG/ML IJ SOLN: 6.2500 mg | INTRAMUSCULAR | Status: DC | PRN

## 2020-01-20 MED ORDER — FENTANYL CITRATE (PF) 100 MCG/2ML IJ SOLN
25.0000 ug | INTRAMUSCULAR | Status: DC | PRN
  Administered 2020-01-20: 50 ug via INTRAVENOUS
  Administered 2020-01-20: 25 ug via INTRAVENOUS

## 2020-01-20 SURGICAL SUPPLY — 50 items
BIT DRILL LONG 4.0 (BIT) ×1 IMPLANT
BIT DRILL SHORT 4.0 (BIT) ×1 IMPLANT
BNDG ELASTIC 4X5.8 VLCR STR LF (GAUZE/BANDAGES/DRESSINGS) ×6 IMPLANT
BRUSH SCRUB EZ PLAIN DRY (MISCELLANEOUS) ×6 IMPLANT
COVER SURGICAL LIGHT HANDLE (MISCELLANEOUS) ×6 IMPLANT
DRAPE C-ARMOR (DRAPES) ×3 IMPLANT
DRAPE HALF SHEET 40X57 (DRAPES) IMPLANT
DRAPE INCISE IOBAN 66X45 STRL (DRAPES) ×3 IMPLANT
DRAPE ORTHO SPLIT 77X108 STRL (DRAPES) ×4
DRAPE SURG ORHT 6 SPLT 77X108 (DRAPES) ×2 IMPLANT
DRAPE U-SHAPE 47X51 STRL (DRAPES) ×3 IMPLANT
DRILL BIT LONG 4.0 (BIT) ×3
DRILL BIT SHORT 4.0 (BIT) ×3
DRSG MEPILEX BORDER 4X4 (GAUZE/BANDAGES/DRESSINGS) ×9 IMPLANT
DRSG MEPILEX BORDER 4X8 (GAUZE/BANDAGES/DRESSINGS) ×3 IMPLANT
ELECT REM PT RETURN 9FT ADLT (ELECTROSURGICAL) ×3
ELECTRODE REM PT RTRN 9FT ADLT (ELECTROSURGICAL) ×1 IMPLANT
GLOVE BIO SURGEON STRL SZ7.5 (GLOVE) ×3 IMPLANT
GLOVE BIO SURGEON STRL SZ8 (GLOVE) ×3 IMPLANT
GLOVE BIOGEL PI IND STRL 7.5 (GLOVE) ×1 IMPLANT
GLOVE BIOGEL PI IND STRL 8 (GLOVE) ×1 IMPLANT
GLOVE BIOGEL PI INDICATOR 7.5 (GLOVE) ×2
GLOVE BIOGEL PI INDICATOR 8 (GLOVE) ×2
GOWN STRL REUS W/ TWL LRG LVL3 (GOWN DISPOSABLE) ×2 IMPLANT
GOWN STRL REUS W/ TWL XL LVL3 (GOWN DISPOSABLE) ×1 IMPLANT
GOWN STRL REUS W/TWL LRG LVL3 (GOWN DISPOSABLE) ×6
GOWN STRL REUS W/TWL XL LVL3 (GOWN DISPOSABLE) ×3
GUIDE PIN 3.2X343 (PIN) ×2
GUIDE PIN 3.2X343MM (PIN) ×6
GUIDE ROD 3.0 (MISCELLANEOUS) ×3
KIT BASIN OR (CUSTOM PROCEDURE TRAY) ×3 IMPLANT
KIT TURNOVER KIT B (KITS) ×3 IMPLANT
NAIL LOCK CANN 10X380 130D (Screw) ×3 IMPLANT
NS IRRIG 1000ML POUR BTL (IV SOLUTION) ×3 IMPLANT
PACK GENERAL/GYN (CUSTOM PROCEDURE TRAY) ×3 IMPLANT
PIN GUIDE 3.2X343MM (PIN) ×2 IMPLANT
ROD GUIDE 3.0 (MISCELLANEOUS) ×1 IMPLANT
SCREW TRIGEN LOW PROF 5.0X35 (Screw) ×3 IMPLANT
SCREW TRIGEN LOW PROF 5.0X60 (Screw) ×3 IMPLANT
STAPLER VISISTAT 35W (STAPLE) ×3 IMPLANT
SUT ETHILON 2 0 PSLX (SUTURE) ×3 IMPLANT
SUT ETHILON 3 0 PS 1 (SUTURE) ×3 IMPLANT
SUT VIC AB 0 CT1 27 (SUTURE) ×2
SUT VIC AB 0 CT1 27XBRD ANBCTR (SUTURE) ×1 IMPLANT
SUT VIC AB 1 CT1 27 (SUTURE) ×3
SUT VIC AB 1 CT1 27XBRD ANBCTR (SUTURE) ×1 IMPLANT
SUT VIC AB 2-0 CT1 27 (SUTURE) ×3
SUT VIC AB 2-0 CT1 TAPERPNT 27 (SUTURE) ×1 IMPLANT
TOWEL GREEN STERILE (TOWEL DISPOSABLE) ×6 IMPLANT
TOWEL GREEN STERILE FF (TOWEL DISPOSABLE) ×3 IMPLANT
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
## 2020-01-20 NOTE — Anesthesia Procedure Notes (Signed)
Procedure Name: Intubation Date/Time: 01/20/2020 11:13 AM Performed by: Candis Shine, CRNA Pre-anesthesia Checklist: Patient identified, Emergency Drugs available, Suction available and Patient being monitored Patient Re-evaluated:Patient Re-evaluated prior to induction Oxygen Delivery Method: Circle System Utilized Preoxygenation: Pre-oxygenation with 100% oxygen Induction Type: IV induction Ventilation: Mask ventilation without difficulty Laryngoscope Size: Mac and 4 Grade View: Grade I Tube type: Oral Tube size: 7.5 mm Number of attempts: 1 Airway Equipment and Method: Stylet Placement Confirmation: ETT inserted through vocal cords under direct vision,  positive ETCO2 and breath sounds checked- equal and bilateral Secured at: 22 cm Tube secured with: Tape Dental Injury: Teeth and Oropharynx as per pre-operative assessment
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
## 2020-01-20 NOTE — Transfer of Care (Signed)
Immediate Anesthesia Transfer of Care Note  Patient: Derwood R Rochefort  Procedure(s) Performed: INTRAMEDULLARY (IM) NAIL FEMORAL (Left )  Patient Location: PACU  Anesthesia Type:General  Level of Consciousness: drowsy  Airway & Oxygen Therapy: Patient Spontanous Breathing and Patient connected to face mask oxygen  Post-op Assessment: Report given to RN and Post -op Vital signs reviewed and stable  Post vital signs: Reviewed and stable  Last Vitals:  Vitals Value Taken Time  BP 184/117 01/20/20 1334  Temp    Pulse 92 01/20/20 1336  Resp 20 01/20/20 1336  SpO2 97 % 01/20/20 1336  Vitals shown include unvalidated device data.  Last Pain:  Vitals:   01/20/20 1013  TempSrc:   PainSc: 7       Patients Stated Pain Goal: 0 (01/20/20 0915)  Complications: No apparent anesthesia complications
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
## 2020-01-20 NOTE — Progress Notes (Signed)
Orthopedic Tech Progress Note Patient Details:  Raymond Ford 27-Dec-1975 [id number] Applied an Over Head Frame with Trapeze for patient Patient ID: Raymond Ford, male   DOB: 09/23/1976, 44 y.o.   MRN: [id number]   [name] 01/20/2020, 6:34 PM
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
## 2020-01-20 NOTE — Progress Notes (Signed)
[employer] Detective Gordy Levan [phone]) called wanting RN to pass his number to Pt to set up time to talk.

## 2020-01-20 NOTE — Anesthesia Preprocedure Evaluation (Signed)
Anesthesia Evaluation    Reviewed: Allergy & Precautions, Patient's Chart, lab work & pertinent test results  Airway Mallampati: II  TM Distance: >3 FB Neck ROM: Full    Dental  (+) Partial Upper, Dental Advisory Given   Pulmonary neg pulmonary ROS, Current SmokerPatient did not abstain from smoking.,    Pulmonary exam normal        Cardiovascular hypertension, Normal cardiovascular exam     Neuro/Psych negative neurological ROS     GI/Hepatic negative GI ROS, Neg liver ROS,   Endo/Other  negative endocrine ROS  Renal/GU negative Renal ROS     Musculoskeletal negative musculoskeletal ROS (+)   Abdominal   Peds  Hematology negative hematology ROS (+)   Anesthesia Other Findings Day of surgery medications reviewed with the patient.  Reproductive/Obstetrics                             Anesthesia Physical Anesthesia Plan  ASA: II  Anesthesia Plan: General   Post-op Pain Management:    Induction: Intravenous  PONV Risk Score and Plan: 2 and Ondansetron and Dexamethasone  Airway Management Planned: Oral ETT  Additional Equipment:   Intra-op Plan:   Post-operative Plan: Extubation in OR  Informed Consent: I have reviewed the patients History and Physical, chart, labs and discussed the procedure including the risks, benefits and alternatives for the proposed anesthesia with the patient or authorized representative who has indicated his/her understanding and acceptance.     Dental advisory given  Plan Discussed with: CRNA and Anesthesiologist  Anesthesia Plan Comments:         Anesthesia Quick Evaluation
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
## 2020-01-20 NOTE — Progress Notes (Signed)
Orthopedic Tech Progress Note Patient Details:  Raymond Ford Jul 29, 1976 [id number]  Ortho Devices Type of Ortho Device: Post (long leg) splint Ortho Device/Splint Location: lle Ortho Device/Splint Interventions: Ordered, Application, Adjustment   Post Interventions Patient Tolerated: Well Instructions Provided: Care of device, Adjustment of device   [name] Post 01/20/2020, 3:55 AM

## 2020-01-21 LAB — COMPREHENSIVE METABOLIC PANEL
ALT: 36 U/L (ref 0–44)
AST: 64 U/L — ABNORMAL HIGH (ref 15–41)
Albumin: 3 g/dL — ABNORMAL LOW (ref 3.5–5.0)
Alkaline Phosphatase: 66 U/L (ref 38–126)
Anion gap: 10 (ref 5–15)
BUN: 8 mg/dL (ref 6–20)
CO2: 24 mmol/L (ref 22–32)
Calcium: 8.4 mg/dL — ABNORMAL LOW (ref 8.9–10.3)
Chloride: 104 mmol/L (ref 98–111)
Creatinine, Ser: 1.18 mg/dL (ref 0.61–1.24)
GFR calc Af Amer: >60 mL/min (ref >60)
GFR calc non Af Amer: >60 mL/min (ref >60)
Glucose, Bld: 114 mg/dL — ABNORMAL HIGH (ref 70–99)
Potassium: 3.9 mmol/L (ref 3.5–5.1)
Sodium: 138 mmol/L (ref 135–145)
Total Bilirubin: 0.7 mg/dL (ref 0.3–1.2)
Total Protein: 5.8 g/dL — ABNORMAL LOW (ref 6.5–8.1)

## 2020-01-21 LAB — CBC
HCT: 37.1 % — ABNORMAL LOW (ref 39.0–52.0)
Hemoglobin: 12.5 g/dL — ABNORMAL LOW (ref 13.0–17.0)
MCH: 31.4 pg (ref 26.0–34.0)
MCHC: 33.7 g/dL (ref 30.0–36.0)
MCV: 93.2 fL (ref 80.0–100.0)
Platelets: 207 10*3/uL (ref 150–400)
RBC: 3.98 MIL/uL — ABNORMAL LOW (ref 4.22–5.81)
RDW: 12.2 % (ref 11.5–15.5)
WBC: 11.4 10*3/uL — ABNORMAL HIGH (ref 4.0–10.5)
nRBC: 0 % (ref 0.0–0.2)

## 2020-01-21 MED ORDER — PANTOPRAZOLE SODIUM 40 MG PO TBEC
40.0000 mg | DELAYED_RELEASE_TABLET | Freq: Every day | ORAL | Status: DC
  Administered 2020-01-21 – 2020-01-23 (×3): 40 mg via ORAL
  Filled 2020-01-21 (×2): qty 1

## 2020-01-21 MED ORDER — NICOTINE 14 MG/24HR TD PT24
14.0000 mg | MEDICATED_PATCH | Freq: Every day | TRANSDERMAL | Status: DC
  Administered 2020-01-21 – 2020-01-23 (×3): 14 mg via TRANSDERMAL
  Filled 2020-01-21 (×3): qty 1
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
## 2020-01-21 NOTE — TOC Progression Note (Signed)
Transition of Care (TOC) - Progression Note    Patient Details  Name: Raymond Ford MRN: [id number] Date of Birth: 04/01/1976  Transition of Care (TOC) CM/SW Contact   R , LCSW Phone Number: 01/21/2020, 2:15 PM  Clinical Narrative: CSW met with patient to engage in SBIRT per trauma protocol. Patient scored a total of 23 on their SBIRT exam. CSW provided psychoeducation on substance use and offered patient with resources as appropriate. CSW provided brief intervention to patient discussing his reported prior history of sobriety for 7 years related to incarceration. Patient accepted outpatient resources. Patient responded openly to discussions of substance use. Patient request follow-up when closer to discharge.         Expected Discharge Plan and Services                                                 Social Determinants of Health (SDOH) Interventions    Readmission Risk Interventions No flowsheet data found.  
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
## 2020-01-22 LAB — CBC
HCT: 33.3 % — ABNORMAL LOW (ref 39.0–52.0)
Hemoglobin: 11.2 g/dL — ABNORMAL LOW (ref 13.0–17.0)
MCH: 31.5 pg (ref 26.0–34.0)
MCHC: 33.6 g/dL (ref 30.0–36.0)
MCV: 93.8 fL (ref 80.0–100.0)
Platelets: 214 10*3/uL (ref 150–400)
RBC: 3.55 MIL/uL — ABNORMAL LOW (ref 4.22–5.81)
RDW: 11.7 % (ref 11.5–15.5)
WBC: 11.3 10*3/uL — ABNORMAL HIGH (ref 4.0–10.5)
nRBC: 0 % (ref 0.0–0.2)
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
## 2020-01-23 ENCOUNTER — Encounter (HOSPITAL_COMMUNITY): Payer: Self-pay | Admitting: Emergency Medicine

## 2020-01-23 ENCOUNTER — Encounter (HOSPITAL_COMMUNITY): Payer: Self-pay

## 2020-01-23 DIAGNOSIS — S72302B Unspecified fracture of shaft of left femur, initial encounter for open fracture type I or II: Secondary | ICD-10-CM

## 2020-01-23 DIAGNOSIS — G5732 Lesion of lateral popliteal nerve, left lower limb: Secondary | ICD-10-CM

## 2020-01-23 HISTORY — DX: Unspecified fracture of shaft of left femur, initial encounter for open fracture type I or II: S72.302B

## 2020-01-23 HISTORY — DX: Lesion of lateral popliteal nerve, left lower limb: G57.32

## 2020-01-23 MED ORDER — OXYCODONE HCL 10 MG PO TABS: 5.0000 mg | ORAL_TABLET | Freq: Four times a day (QID) | ORAL | 0 refills | Status: DC | PRN

## 2020-01-23 MED ORDER — ASPIRIN EC 325 MG PO TBEC: 325.0000 mg | DELAYED_RELEASE_TABLET | Freq: Every day | ORAL | 0 refills | Status: DC

## 2020-01-23 MED ORDER — IBUPROFEN 600 MG PO TABS: 600.0000 mg | ORAL_TABLET | Freq: Four times a day (QID) | ORAL | 0 refills | Status: DC | PRN

## 2020-01-23 MED ORDER — ACETAMINOPHEN 500 MG PO TABS: 1000.0000 mg | ORAL_TABLET | Freq: Four times a day (QID) | ORAL | 0 refills | Status: DC

## 2020-01-23 MED ORDER — ASCORBIC ACID 500 MG PO TABS
1000.0000 mg | ORAL_TABLET | Freq: Every day | ORAL | Status: DC
  Administered 2020-01-23: 1000 mg via ORAL
  Filled 2020-01-23: qty 2

## 2020-01-23 MED ORDER — VITAMIN D3 25 MCG PO TABS: 2000.0000 [IU] | ORAL_TABLET | Freq: Two times a day (BID) | ORAL | 2 refills | Status: AC

## 2020-01-23 MED ORDER — VITAMIN D 25 MCG (1000 UNIT) PO TABS
2000.0000 [IU] | ORAL_TABLET | Freq: Two times a day (BID) | ORAL | Status: DC
  Administered 2020-01-23: 2000 [IU] via ORAL
  Filled 2020-01-23: qty 2

## 2020-01-23 MED ORDER — VITAMIN D3 25 MCG PO TABS: 2000.0000 [IU] | ORAL_TABLET | Freq: Two times a day (BID) | ORAL | 2 refills | Status: DC

## 2020-01-23 MED ORDER — ASCORBIC ACID 1000 MG PO TABS: 1000.0000 mg | ORAL_TABLET | Freq: Every day | ORAL | 1 refills | Status: DC

## 2020-01-23 MED FILL — ASPIRIN EC 325 MG TABLET: 325 | 30 days supply | Qty: 30 | Fill #0

## 2020-01-23 MED FILL — oxyCODONE HCL 10 MG TABS: 10 | 5 days supply | Qty: 30 | Fill #0

## 2020-01-23 NOTE — Discharge Instructions (Signed)
You may weightbare as tolerates on your left lower extremity

## 2020-01-23 NOTE — Progress Notes (Signed)
Pt being discharged home via wheelchair with family. Pt alert and oriented x4. VSS. Pt c/o no pain at this time. No signs of respiratory distress. Education complete and care plans resolved. IV removed with catheter intact and pt tolerated well. No further issues at this time. Pt to follow up with PCP. Corlette Ciano R, RN 
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
## 2020-01-24 ENCOUNTER — Emergency Department (HOSPITAL_COMMUNITY)
Admission: EM | Admit: 2020-01-24 | Discharge: 2020-01-24 | Disposition: A | Discharge status: Home / Self Care | Payer: Self-pay | Attending: Emergency Medicine | Admitting: Emergency Medicine

## 2020-01-24 ENCOUNTER — Other Ambulatory Visit: Payer: Self-pay

## 2020-01-24 ENCOUNTER — Emergency Department (HOSPITAL_COMMUNITY): Payer: Self-pay

## 2020-01-24 ENCOUNTER — Encounter: Payer: Self-pay | Admitting: *Deleted

## 2020-01-24 DIAGNOSIS — G8918 Other acute postprocedural pain: Secondary | ICD-10-CM | POA: Insufficient documentation

## 2020-01-24 DIAGNOSIS — I10 Essential (primary) hypertension: Secondary | ICD-10-CM | POA: Insufficient documentation

## 2020-01-24 DIAGNOSIS — M792 Neuralgia and neuritis, unspecified: Secondary | ICD-10-CM | POA: Insufficient documentation

## 2020-01-24 DIAGNOSIS — Z79899 Other long term (current) drug therapy: Secondary | ICD-10-CM | POA: Insufficient documentation

## 2020-01-24 DIAGNOSIS — F17210 Nicotine dependence, cigarettes, uncomplicated: Secondary | ICD-10-CM | POA: Insufficient documentation

## 2020-01-24 DIAGNOSIS — Z7982 Long term (current) use of aspirin: Secondary | ICD-10-CM | POA: Insufficient documentation

## 2020-01-24 LAB — I-STAT CHEM 8, ED
BUN: 8 mg/dL (ref 6–20)
Calcium, Ion: 1.2 mmol/L (ref 1.15–1.40)
Chloride: 103 mmol/L (ref 98–111)
Creatinine, Ser: 0.8 mg/dL (ref 0.61–1.24)
Glucose, Bld: 93 mg/dL (ref 70–99)
HCT: 34 % — ABNORMAL LOW (ref 39.0–52.0)
Hemoglobin: 11.6 g/dL — ABNORMAL LOW (ref 13.0–17.0)
Potassium: 3.8 mmol/L (ref 3.5–5.1)
Sodium: 139 mmol/L (ref 135–145)
TCO2: 28 mmol/L (ref 22–32)

## 2020-01-24 LAB — CBC WITH DIFFERENTIAL/PLATELET
Abs Immature Granulocytes: 0.04 10*3/uL (ref 0.00–0.07)
Basophils Absolute: 0 10*3/uL (ref 0.0–0.1)
Basophils Relative: 0 %
Eosinophils Absolute: 0.2 10*3/uL (ref 0.0–0.5)
Eosinophils Relative: 2 %
HCT: 34.5 % — ABNORMAL LOW (ref 39.0–52.0)
Hemoglobin: 11.6 g/dL — ABNORMAL LOW (ref 13.0–17.0)
Immature Granulocytes: 0 %
Lymphocytes Relative: 17 %
Lymphs Abs: 1.7 10*3/uL (ref 0.7–4.0)
MCH: 31.2 pg (ref 26.0–34.0)
MCHC: 33.6 g/dL (ref 30.0–36.0)
MCV: 92.7 fL (ref 80.0–100.0)
Monocytes Absolute: 0.6 10*3/uL (ref 0.1–1.0)
Monocytes Relative: 6 %
Neutro Abs: 7.3 10*3/uL (ref 1.7–7.7)
Neutrophils Relative %: 75 %
Platelets: 301 10*3/uL (ref 150–400)
RBC: 3.72 MIL/uL — ABNORMAL LOW (ref 4.22–5.81)
RDW: 11.4 % — ABNORMAL LOW (ref 11.5–15.5)
WBC: 9.9 10*3/uL (ref 4.0–10.5)
nRBC: 0 % (ref 0.0–0.2)

## 2020-01-24 MED ORDER — GABAPENTIN 100 MG PO CAPS: 100.0000 mg | ORAL_CAPSULE | Freq: Three times a day (TID) | ORAL | 0 refills | Status: DC

## 2020-01-24 MED ORDER — HYDROMORPHONE HCL 1 MG/ML IJ SOLN
1.0000 mg | Freq: Once | INTRAMUSCULAR | Status: AC
  Administered 2020-01-24: 1 mg via INTRAVENOUS
  Filled 2020-01-24: qty 1

## 2020-01-24 MED ORDER — GABAPENTIN 600 MG PO TABS
300.0000 mg | ORAL_TABLET | Freq: Once | ORAL | Status: AC
  Administered 2020-01-24: 300 mg via ORAL
  Filled 2020-01-24: qty 0.5
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
## 2020-01-24 NOTE — Discharge Instructions (Signed)
Your leg pain is due to nerve irritation.  Please wear TED hose to provide support and comfort.  Keep legs elevated at rest.  Take gabapentin as prescribed.  Call and follow-up closely with your orthopedist as previously scheduled.  Return if you have any concern.

## 2020-01-24 NOTE — Anesthesia Postprocedure Evaluation (Signed)
Anesthesia Post Note  Patient: Raymond Ford  Procedure(s) Performed: INTRAMEDULLARY (IM) NAIL FEMORAL (Left )     Patient location during evaluation: PACU Anesthesia Type: General Level of consciousness: awake and alert Pain management: pain level controlled Vital Signs Assessment: post-procedure vital signs reviewed and stable Respiratory status: spontaneous breathing, nonlabored ventilation, respiratory function stable and patient connected to nasal cannula oxygen Cardiovascular status: blood pressure returned to baseline and stable Postop Assessment: no apparent nausea or vomiting Anesthetic complications: no    Last Vitals:  Vitals:   01/23/20 0516 01/23/20 0742  BP: (!) 129/98 136/88  Pulse: 67 (!) 57  Resp: 16 14  Temp: 37.2 C 36.7 C  SpO2: 100% 99%    Last Pain:  Vitals:   01/23/20 0800  TempSrc:   PainSc: 9                  Ethelene Closser S

## 2020-01-24 NOTE — ED Notes (Signed)
Patient transported to X-ray 

## 2020-01-26 LAB — BPAM RBC
Blood Product Expiration Date: 202103152359
Blood Product Expiration Date: 202103162359
ISSUE DATE / TIME: 202102190122
ISSUE DATE / TIME: 202102190133
Unit Type and Rh: 5100
Unit Type and Rh: 5100

## 2020-01-26 LAB — TYPE AND SCREEN
ABO/RH(D): O POS
Antibody Screen: NEGATIVE
Unit division: 0
Unit division: 0

## 2020-02-08 ENCOUNTER — Encounter (HOSPITAL_COMMUNITY): Payer: Self-pay | Admitting: Emergency Medicine

## 2020-02-08 ENCOUNTER — Emergency Department (HOSPITAL_COMMUNITY)
Admission: EM | Admit: 2020-02-08 | Discharge: 2020-02-09 | Disposition: A | Discharge status: Home / Self Care | Payer: Self-pay | Attending: Emergency Medicine | Admitting: Emergency Medicine

## 2020-02-08 ENCOUNTER — Other Ambulatory Visit: Payer: Self-pay

## 2020-02-08 DIAGNOSIS — M79605 Pain in left leg: Secondary | ICD-10-CM | POA: Insufficient documentation

## 2020-02-08 DIAGNOSIS — Z5321 Procedure and treatment not carried out due to patient leaving prior to being seen by health care provider: Secondary | ICD-10-CM | POA: Insufficient documentation

## 2020-02-08 MED ORDER — OXYCODONE-ACETAMINOPHEN 5-325 MG PO TABS
1.0000 | ORAL_TABLET | ORAL | Status: DC | PRN
  Administered 2020-02-08: 1 via ORAL
  Filled 2020-02-08: qty 1
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
## 2020-02-09 ENCOUNTER — Encounter (HOSPITAL_COMMUNITY): Payer: Self-pay | Admitting: Emergency Medicine

## 2020-02-09 ENCOUNTER — Other Ambulatory Visit: Payer: Self-pay

## 2020-02-09 ENCOUNTER — Emergency Department (HOSPITAL_COMMUNITY)
Admission: EM | Admit: 2020-02-09 | Discharge: 2020-02-09 | Disposition: A | Discharge status: Home / Self Care | Payer: Self-pay | Attending: Emergency Medicine | Admitting: Emergency Medicine

## 2020-02-09 DIAGNOSIS — S8412XD Injury of peroneal nerve at lower leg level, left leg, subsequent encounter: Secondary | ICD-10-CM | POA: Insufficient documentation

## 2020-02-09 DIAGNOSIS — W3400XD Accidental discharge from unspecified firearms or gun, subsequent encounter: Secondary | ICD-10-CM | POA: Insufficient documentation

## 2020-02-09 DIAGNOSIS — F17210 Nicotine dependence, cigarettes, uncomplicated: Secondary | ICD-10-CM | POA: Insufficient documentation

## 2020-02-09 DIAGNOSIS — M79605 Pain in left leg: Secondary | ICD-10-CM

## 2020-02-09 MED ORDER — ACETAMINOPHEN 325 MG PO TABS
650.0000 mg | ORAL_TABLET | Freq: Once | ORAL | Status: AC | PRN
  Administered 2020-02-09: 07:00:00 650 mg via ORAL
  Filled 2020-02-09: qty 2

## 2020-02-09 MED ORDER — OXYCODONE-ACETAMINOPHEN 5-325 MG PO TABS: 1.0000 | ORAL_TABLET | ORAL | 0 refills | Status: DC | PRN

## 2020-02-09 MED ORDER — OXYCODONE-ACETAMINOPHEN 5-325 MG PO TABS
2.0000 | ORAL_TABLET | Freq: Once | ORAL | Status: AC
  Administered 2020-02-09: 2 via ORAL
  Filled 2020-02-09: qty 2

## 2020-02-09 NOTE — Discharge Instructions (Signed)
Take motrin for pain   Take percocet for severe pain   See Dr. Carola Frost for follow up   Continue Gabapentin for neuropathic pain   Return to ER if you have worse leg pain and numbness
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
## 2020-02-09 NOTE — ED Notes (Signed)
Patient wounds on his left leg. Wounds cleaned and bandages applied.
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
## 2020-02-19 ENCOUNTER — Emergency Department (HOSPITAL_COMMUNITY)
Admission: EM | Admit: 2020-02-19 | Discharge: 2020-02-19 | Disposition: A | Discharge status: Home / Self Care | Payer: Self-pay | Attending: Emergency Medicine | Admitting: Emergency Medicine

## 2020-02-19 ENCOUNTER — Emergency Department (HOSPITAL_COMMUNITY): Payer: Self-pay

## 2020-02-19 ENCOUNTER — Other Ambulatory Visit: Payer: Self-pay

## 2020-02-19 ENCOUNTER — Encounter (HOSPITAL_COMMUNITY): Payer: Self-pay | Admitting: Emergency Medicine

## 2020-02-19 DIAGNOSIS — I10 Essential (primary) hypertension: Secondary | ICD-10-CM | POA: Insufficient documentation

## 2020-02-19 DIAGNOSIS — F17210 Nicotine dependence, cigarettes, uncomplicated: Secondary | ICD-10-CM | POA: Insufficient documentation

## 2020-02-19 DIAGNOSIS — K2970 Gastritis, unspecified, without bleeding: Secondary | ICD-10-CM | POA: Insufficient documentation

## 2020-02-19 DIAGNOSIS — Z79899 Other long term (current) drug therapy: Secondary | ICD-10-CM | POA: Insufficient documentation

## 2020-02-19 LAB — CBC WITH DIFFERENTIAL/PLATELET
Abs Immature Granulocytes: 0.06 10*3/uL (ref 0.00–0.07)
Basophils Absolute: 0 10*3/uL (ref 0.0–0.1)
Basophils Relative: 0 %
Eosinophils Absolute: 0 10*3/uL (ref 0.0–0.5)
Eosinophils Relative: 0 %
HCT: 46.5 % (ref 39.0–52.0)
Hemoglobin: 15.1 g/dL (ref 13.0–17.0)
Immature Granulocytes: 1 %
Lymphocytes Relative: 8 %
Lymphs Abs: 1 10*3/uL (ref 0.7–4.0)
MCH: 31 pg (ref 26.0–34.0)
MCHC: 32.5 g/dL (ref 30.0–36.0)
MCV: 95.5 fL (ref 80.0–100.0)
Monocytes Absolute: 0.3 10*3/uL (ref 0.1–1.0)
Monocytes Relative: 2 %
Neutro Abs: 10.8 10*3/uL — ABNORMAL HIGH (ref 1.7–7.7)
Neutrophils Relative %: 89 %
Platelets: 328 10*3/uL (ref 150–400)
RBC: 4.87 MIL/uL (ref 4.22–5.81)
RDW: 12.2 % (ref 11.5–15.5)
WBC: 12.1 10*3/uL — ABNORMAL HIGH (ref 4.0–10.5)
nRBC: 0 % (ref 0.0–0.2)

## 2020-02-19 LAB — ACETAMINOPHEN LEVEL: Acetaminophen (Tylenol), Serum: <10 ug/mL — ABNORMAL LOW (ref 10–30)

## 2020-02-19 LAB — COMPREHENSIVE METABOLIC PANEL
ALT: 23 U/L (ref 0–44)
AST: 21 U/L (ref 15–41)
Albumin: 4.8 g/dL (ref 3.5–5.0)
Alkaline Phosphatase: 155 U/L — ABNORMAL HIGH (ref 38–126)
Anion gap: 12 (ref 5–15)
BUN: 10 mg/dL (ref 6–20)
CO2: 26 mmol/L (ref 22–32)
Calcium: 9.9 mg/dL (ref 8.9–10.3)
Chloride: 102 mmol/L (ref 98–111)
Creatinine, Ser: 0.85 mg/dL (ref 0.61–1.24)
GFR calc Af Amer: >60 mL/min (ref >60)
GFR calc non Af Amer: >60 mL/min (ref >60)
Glucose, Bld: 137 mg/dL — ABNORMAL HIGH (ref 70–99)
Potassium: 3.5 mmol/L (ref 3.5–5.1)
Sodium: 140 mmol/L (ref 135–145)
Total Bilirubin: 0.4 mg/dL (ref 0.3–1.2)
Total Protein: 8.5 g/dL — ABNORMAL HIGH (ref 6.5–8.1)

## 2020-02-19 LAB — SALICYLATE LEVEL: Salicylate Lvl: <7 mg/dL — ABNORMAL LOW (ref 7.0–30.0)

## 2020-02-19 LAB — LIPASE, BLOOD: Lipase: 22 U/L (ref 11–51)

## 2020-02-19 MED ORDER — FAMOTIDINE 20 MG PO TABS: 20.0000 mg | ORAL_TABLET | Freq: Two times a day (BID) | ORAL | 0 refills | Status: DC

## 2020-02-19 MED ORDER — MORPHINE SULFATE (PF) 4 MG/ML IV SOLN
8.0000 mg | Freq: Once | INTRAVENOUS | Status: AC
  Administered 2020-02-19: 10:00:00 8 mg via INTRAVENOUS
  Filled 2020-02-19: qty 2

## 2020-02-19 MED ORDER — SODIUM CHLORIDE 0.9 % IV BOLUS
2000.0000 mL | Freq: Once | INTRAVENOUS | Status: AC
  Administered 2020-02-19: 2000 mL via INTRAVENOUS

## 2020-02-19 MED ORDER — SODIUM CHLORIDE 0.9 % IV SOLN: INTRAVENOUS | Status: DC

## 2020-02-19 MED ORDER — METOCLOPRAMIDE HCL 5 MG/ML IJ SOLN
10.0000 mg | Freq: Once | INTRAMUSCULAR | Status: AC
  Administered 2020-02-19: 10 mg via INTRAVENOUS
  Filled 2020-02-19: qty 2

## 2020-02-19 MED ORDER — SUCRALFATE 1 G PO TABS: 1.0000 g | ORAL_TABLET | Freq: Four times a day (QID) | ORAL | 0 refills | Status: DC

## 2020-02-19 MED ORDER — PANTOPRAZOLE SODIUM 40 MG IV SOLR
40.0000 mg | Freq: Once | INTRAVENOUS | Status: AC
  Administered 2020-02-19: 40 mg via INTRAVENOUS
  Filled 2020-02-19: qty 40
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
## 2020-03-11 ENCOUNTER — Other Ambulatory Visit: Payer: Self-pay

## 2020-03-11 ENCOUNTER — Emergency Department (HOSPITAL_COMMUNITY)
Admission: EM | Admit: 2020-03-11 | Discharge: 2020-03-11 | Disposition: A | Discharge status: Home / Self Care | Payer: Self-pay | Attending: Emergency Medicine | Admitting: Emergency Medicine

## 2020-03-11 DIAGNOSIS — F17210 Nicotine dependence, cigarettes, uncomplicated: Secondary | ICD-10-CM | POA: Insufficient documentation

## 2020-03-11 DIAGNOSIS — R112 Nausea with vomiting, unspecified: Secondary | ICD-10-CM

## 2020-03-11 DIAGNOSIS — F1193 Opioid use, unspecified with withdrawal: Secondary | ICD-10-CM

## 2020-03-11 DIAGNOSIS — M79662 Pain in left lower leg: Secondary | ICD-10-CM | POA: Insufficient documentation

## 2020-03-11 DIAGNOSIS — F1123 Opioid dependence with withdrawal: Secondary | ICD-10-CM | POA: Insufficient documentation

## 2020-03-11 DIAGNOSIS — Z79899 Other long term (current) drug therapy: Secondary | ICD-10-CM | POA: Insufficient documentation

## 2020-03-11 DIAGNOSIS — G8929 Other chronic pain: Secondary | ICD-10-CM

## 2020-03-11 DIAGNOSIS — I10 Essential (primary) hypertension: Secondary | ICD-10-CM | POA: Insufficient documentation

## 2020-03-11 LAB — CBC
HCT: 44.3 % (ref 39.0–52.0)
Hemoglobin: 14.9 g/dL (ref 13.0–17.0)
MCH: 31.2 pg (ref 26.0–34.0)
MCHC: 33.6 g/dL (ref 30.0–36.0)
MCV: 92.7 fL (ref 80.0–100.0)
Platelets: 367 10*3/uL (ref 150–400)
RBC: 4.78 MIL/uL (ref 4.22–5.81)
RDW: 11.5 % (ref 11.5–15.5)
WBC: 18.5 10*3/uL — ABNORMAL HIGH (ref 4.0–10.5)
nRBC: 0 % (ref 0.0–0.2)

## 2020-03-11 LAB — SALICYLATE LEVEL: Salicylate Lvl: <7 mg/dL — ABNORMAL LOW (ref 7.0–30.0)

## 2020-03-11 LAB — COMPREHENSIVE METABOLIC PANEL
ALT: 24 U/L (ref 0–44)
AST: 29 U/L (ref 15–41)
Albumin: 4.1 g/dL (ref 3.5–5.0)
Alkaline Phosphatase: 160 U/L — ABNORMAL HIGH (ref 38–126)
Anion gap: 13 (ref 5–15)
BUN: 20 mg/dL (ref 6–20)
CO2: 23 mmol/L (ref 22–32)
Calcium: 9.4 mg/dL (ref 8.9–10.3)
Chloride: 103 mmol/L (ref 98–111)
Creatinine, Ser: 1.05 mg/dL (ref 0.61–1.24)
GFR calc Af Amer: >60 mL/min (ref >60)
GFR calc non Af Amer: >60 mL/min (ref >60)
Glucose, Bld: 110 mg/dL — ABNORMAL HIGH (ref 70–99)
Potassium: 3.7 mmol/L (ref 3.5–5.1)
Sodium: 139 mmol/L (ref 135–145)
Total Bilirubin: 0.3 mg/dL (ref 0.3–1.2)
Total Protein: 7.3 g/dL (ref 6.5–8.1)

## 2020-03-11 LAB — ETHANOL: Alcohol, Ethyl (B): <10 mg/dL (ref <10)

## 2020-03-11 LAB — ACETAMINOPHEN LEVEL: Acetaminophen (Tylenol), Serum: 11 ug/mL (ref 10–30)

## 2020-03-11 MED ORDER — HYDROMORPHONE HCL 1 MG/ML IJ SOLN
1.0000 mg | Freq: Once | INTRAMUSCULAR | Status: AC
  Administered 2020-03-11: 22:00:00 1 mg via INTRAVENOUS
  Filled 2020-03-11: qty 1

## 2020-03-11 MED ORDER — OXYCODONE-ACETAMINOPHEN 5-325 MG PO TABS: 1.0000 | ORAL_TABLET | Freq: Four times a day (QID) | ORAL | 0 refills | Status: DC | PRN

## 2020-03-11 MED ORDER — ONDANSETRON HCL 4 MG/2ML IJ SOLN
4.0000 mg | Freq: Once | INTRAMUSCULAR | Status: AC
  Administered 2020-03-11: 4 mg via INTRAVENOUS
  Filled 2020-03-11: qty 2

## 2020-03-11 MED ORDER — AMLODIPINE BESYLATE 5 MG PO TABS: 5.0000 mg | ORAL_TABLET | Freq: Every day | ORAL | 0 refills | Status: DC
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
## 2020-03-11 NOTE — ED Notes (Signed)
Med given 

## 2020-03-11 NOTE — ED Notes (Signed)
The pt is c/o being cold warm blankets applied

## 2020-03-11 NOTE — ED Notes (Signed)
Patient mother Blima Singer is asking for an update on patient also asking if patient can call her back  [phone]
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
## 2020-03-11 NOTE — Discharge Instructions (Addendum)
It was our pleasure to provide your ER care today - we hope that you feel better.  Take motrin or aleve as need for pain. You may also take percocet as need for pain. No driving for the next 8 hours or when taking percocet. Also, do not take tylenol or acetaminophen containing medication when taking percocet.  Follow up with your doctor in the coming week.   Note that increasingly we are see marijuana/thc as a cause of a recurrent vomiting and abdominal pain syndrome - avoid marijuana use will result in resolution of those symptoms.   Make sure to never take any medication in the over the recommended or prescribed dose.   Your blood pressure is high - take medication as prescribed, and follow up with primary care doctor in 1 week.   Return to ER if worse, new symptoms, new or severe pain, persistent vomiting, fevers, trouble breathing, or other concern.   You were given pain medication in the ER - no driving for the next 8 hours, or if/when taking opiate pain medication.

## 2020-03-11 NOTE — ED Notes (Signed)
Pt reports that he has bleeding under the skin of his  Lt foot  Bottom  And it moves to his toes on his lt foot

## 2020-03-18 ENCOUNTER — Emergency Department (HOSPITAL_COMMUNITY)
Admission: EM | Admit: 2020-03-18 | Discharge: 2020-03-18 | Disposition: A | Discharge status: Home / Self Care | Payer: Self-pay | Attending: Emergency Medicine | Admitting: Emergency Medicine

## 2020-03-18 ENCOUNTER — Other Ambulatory Visit: Payer: Self-pay

## 2020-03-18 ENCOUNTER — Emergency Department (HOSPITAL_COMMUNITY): Payer: Self-pay

## 2020-03-18 ENCOUNTER — Encounter (HOSPITAL_COMMUNITY): Payer: Self-pay | Admitting: Emergency Medicine

## 2020-03-18 DIAGNOSIS — R079 Chest pain, unspecified: Secondary | ICD-10-CM | POA: Insufficient documentation

## 2020-03-18 DIAGNOSIS — F17210 Nicotine dependence, cigarettes, uncomplicated: Secondary | ICD-10-CM | POA: Insufficient documentation

## 2020-03-18 DIAGNOSIS — I10 Essential (primary) hypertension: Secondary | ICD-10-CM | POA: Insufficient documentation

## 2020-03-18 DIAGNOSIS — Z79899 Other long term (current) drug therapy: Secondary | ICD-10-CM | POA: Insufficient documentation

## 2020-03-18 DIAGNOSIS — R519 Headache, unspecified: Secondary | ICD-10-CM | POA: Insufficient documentation

## 2020-03-18 LAB — COMPREHENSIVE METABOLIC PANEL
ALT: 26 U/L (ref 0–44)
AST: 27 U/L (ref 15–41)
Albumin: 4.2 g/dL (ref 3.5–5.0)
Alkaline Phosphatase: 146 U/L — ABNORMAL HIGH (ref 38–126)
Anion gap: 13 (ref 5–15)
BUN: 9 mg/dL (ref 6–20)
CO2: 22 mmol/L (ref 22–32)
Calcium: 9.6 mg/dL (ref 8.9–10.3)
Chloride: 103 mmol/L (ref 98–111)
Creatinine, Ser: 0.92 mg/dL (ref 0.61–1.24)
GFR calc Af Amer: >60 mL/min (ref >60)
GFR calc non Af Amer: >60 mL/min (ref >60)
Glucose, Bld: 111 mg/dL — ABNORMAL HIGH (ref 70–99)
Potassium: 3.8 mmol/L (ref 3.5–5.1)
Sodium: 138 mmol/L (ref 135–145)
Total Bilirubin: 0.8 mg/dL (ref 0.3–1.2)
Total Protein: 8.1 g/dL (ref 6.5–8.1)

## 2020-03-18 LAB — CBC
HCT: 49.9 % (ref 39.0–52.0)
Hemoglobin: 16.7 g/dL (ref 13.0–17.0)
MCH: 31.1 pg (ref 26.0–34.0)
MCHC: 33.5 g/dL (ref 30.0–36.0)
MCV: 92.9 fL (ref 80.0–100.0)
Platelets: 357 10*3/uL (ref 150–400)
RBC: 5.37 MIL/uL (ref 4.22–5.81)
RDW: 11.6 % (ref 11.5–15.5)
WBC: 12.2 10*3/uL — ABNORMAL HIGH (ref 4.0–10.5)
nRBC: 0 % (ref 0.0–0.2)

## 2020-03-18 LAB — TROPONIN I (HIGH SENSITIVITY): Troponin I (High Sensitivity): 5 ng/L (ref <18)

## 2020-03-18 MED ORDER — SODIUM CHLORIDE 0.9 % IV BOLUS
500.0000 mL | Freq: Once | INTRAVENOUS | Status: AC
  Administered 2020-03-18: 500 mL via INTRAVENOUS

## 2020-03-18 MED ORDER — OXYCODONE-ACETAMINOPHEN 5-325 MG PO TABS
1.0000 | ORAL_TABLET | Freq: Once | ORAL | Status: AC
  Administered 2020-03-18: 1 via ORAL
  Filled 2020-03-18: qty 1

## 2020-03-18 MED ORDER — AMLODIPINE BESYLATE 5 MG PO TABS: 5.0000 mg | ORAL_TABLET | Freq: Every day | ORAL | 0 refills | Status: DC

## 2020-03-18 MED ORDER — METOCLOPRAMIDE HCL 5 MG/ML IJ SOLN
10.0000 mg | Freq: Once | INTRAMUSCULAR | Status: AC
  Administered 2020-03-18: 10 mg via INTRAVENOUS
  Filled 2020-03-18: qty 2

## 2020-03-18 MED ORDER — DIPHENHYDRAMINE HCL 50 MG/ML IJ SOLN
25.0000 mg | Freq: Once | INTRAMUSCULAR | Status: AC
  Administered 2020-03-18: 25 mg via INTRAVENOUS
  Filled 2020-03-18: qty 1
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
## 2020-03-18 NOTE — ED Notes (Signed)
Pt discharge instructions and prescriptions reviewed. Pt verbalized understanding of both. Pt discharged. 

## 2020-03-18 NOTE — ED Triage Notes (Signed)
Pt BIB [facility] from home. Pt complaining of chest pain and hypertension. Pain 3/10. Pt states he ran out of his BP meds. BP in 190s with EMS. Received 324 aspirin via EMS. VSS. NAD.

## 2020-03-18 NOTE — Discharge Instructions (Signed)
Please read and follow all provided instructions.  Your diagnoses today include:  1. Essential hypertension   2. Acute nonintractable headache, unspecified headache type     Tests performed today include:  CT of your head which was normal and did not show any serious cause of your headache  Blood counts and electrolytes - look okay  Blood test for stress on the heart - no signs of stress on the heart  Chest x-ray - no pneumonia or other problems  Vital signs. See below for your results today.   Medications:   Amlodipine - medication for high blood pressure  Take any prescribed medications only as directed.  Additional information:  Follow any educational materials contained in this packet.  You are having a headache. No specific cause was found today for your headache. It may have been a migraine or other cause of headache. Stress, anxiety, fatigue, and depression are common triggers for headaches.   Your headache today does not appear to be life-threatening or require hospitalization, but often the exact cause of headaches is not determined in the emergency department. Therefore, follow-up with your doctor is very important to find out what may have caused your headache and whether or not you need any further diagnostic testing or treatment.   Sometimes headaches can appear benign (not harmful), but then more serious symptoms can develop which should prompt an immediate re-evaluation by your doctor or the emergency department.  BE VERY CAREFUL not to take multiple medicines containing Tylenol (also called acetaminophen). Doing so can lead to an overdose which can damage your liver and cause liver failure and possibly death.   Follow-up instructions: Please follow-up with your primary care provider in the next 3 days for further evaluation of your symptoms.   Return instructions:   Please return to the Emergency Department if you experience worsening symptoms.  Return if  the medications do not resolve your headache, if it recurs, or if you have multiple episodes of vomiting or cannot keep down fluids.  Return if you have a change from the usual headache.  RETURN IMMEDIATELY IF you:  Develop a sudden, severe headache  Develop confusion or become poorly responsive or faint  Develop a fever above 100.51F or problem breathing  Have a change in speech, vision, swallowing, or understanding  Develop new weakness, numbness, tingling, incoordination in your arms or legs  Have a seizure  Please return if you have any other emergent concerns.  Additional Information:  Your vital signs today were: BP (!) 141/100   Pulse 76   Temp 98.4 F (36.9 C) (Oral)   Resp 17   Ht 5\' 10"  (1.778 m)   Wt 83 kg   SpO2 99%   BMI 26.26 kg/m  If your blood pressure (BP) was elevated above 135/85 this visit, please have this repeated by your doctor within one month. --------------

## 2020-03-19 ENCOUNTER — Emergency Department (HOSPITAL_COMMUNITY)
Admission: EM | Admit: 2020-03-19 | Discharge: 2020-03-19 | Disposition: A | Discharge status: Home / Self Care | Payer: Self-pay | Attending: Emergency Medicine | Admitting: Emergency Medicine

## 2020-03-19 ENCOUNTER — Encounter (HOSPITAL_COMMUNITY): Payer: Self-pay | Admitting: Emergency Medicine

## 2020-03-19 DIAGNOSIS — R112 Nausea with vomiting, unspecified: Secondary | ICD-10-CM | POA: Insufficient documentation

## 2020-03-19 DIAGNOSIS — Z79899 Other long term (current) drug therapy: Secondary | ICD-10-CM | POA: Insufficient documentation

## 2020-03-19 DIAGNOSIS — F17210 Nicotine dependence, cigarettes, uncomplicated: Secondary | ICD-10-CM | POA: Insufficient documentation

## 2020-03-19 DIAGNOSIS — R519 Headache, unspecified: Secondary | ICD-10-CM | POA: Insufficient documentation

## 2020-03-19 DIAGNOSIS — I10 Essential (primary) hypertension: Secondary | ICD-10-CM | POA: Insufficient documentation

## 2020-03-19 LAB — COMPREHENSIVE METABOLIC PANEL
ALT: 30 U/L (ref 0–44)
AST: 28 U/L (ref 15–41)
Albumin: 4.6 g/dL (ref 3.5–5.0)
Alkaline Phosphatase: 154 U/L — ABNORMAL HIGH (ref 38–126)
Anion gap: 11 (ref 5–15)
BUN: 10 mg/dL (ref 6–20)
CO2: 24 mmol/L (ref 22–32)
Calcium: 9.5 mg/dL (ref 8.9–10.3)
Chloride: 103 mmol/L (ref 98–111)
Creatinine, Ser: 0.98 mg/dL (ref 0.61–1.24)
GFR calc Af Amer: >60 mL/min (ref >60)
GFR calc non Af Amer: >60 mL/min (ref >60)
Glucose, Bld: 124 mg/dL — ABNORMAL HIGH (ref 70–99)
Potassium: 3.4 mmol/L — ABNORMAL LOW (ref 3.5–5.1)
Sodium: 138 mmol/L (ref 135–145)
Total Bilirubin: 0.5 mg/dL (ref 0.3–1.2)
Total Protein: 8 g/dL (ref 6.5–8.1)

## 2020-03-19 LAB — CBC WITH DIFFERENTIAL/PLATELET
Abs Immature Granulocytes: 0.04 10*3/uL (ref 0.00–0.07)
Basophils Absolute: 0 10*3/uL (ref 0.0–0.1)
Basophils Relative: 0 %
Eosinophils Absolute: 0 10*3/uL (ref 0.0–0.5)
Eosinophils Relative: 0 %
HCT: 48.2 % (ref 39.0–52.0)
Hemoglobin: 16.2 g/dL (ref 13.0–17.0)
Immature Granulocytes: 0 %
Lymphocytes Relative: 7 %
Lymphs Abs: 1.1 10*3/uL (ref 0.7–4.0)
MCH: 30.9 pg (ref 26.0–34.0)
MCHC: 33.6 g/dL (ref 30.0–36.0)
MCV: 92 fL (ref 80.0–100.0)
Monocytes Absolute: 0.4 10*3/uL (ref 0.1–1.0)
Monocytes Relative: 3 %
Neutro Abs: 13.5 10*3/uL — ABNORMAL HIGH (ref 1.7–7.7)
Neutrophils Relative %: 90 %
Platelets: 367 10*3/uL (ref 150–400)
RBC: 5.24 MIL/uL (ref 4.22–5.81)
RDW: 11.2 % — ABNORMAL LOW (ref 11.5–15.5)
WBC: 15.1 10*3/uL — ABNORMAL HIGH (ref 4.0–10.5)
nRBC: 0 % (ref 0.0–0.2)

## 2020-03-19 LAB — URINALYSIS, ROUTINE W REFLEX MICROSCOPIC
Bilirubin Urine: NEGATIVE
Glucose, UA: NEGATIVE mg/dL
Hgb urine dipstick: NEGATIVE
Ketones, ur: NEGATIVE mg/dL
Leukocytes,Ua: NEGATIVE
Nitrite: NEGATIVE
Protein, ur: NEGATIVE mg/dL
Specific Gravity, Urine: 1.018 (ref 1.005–1.030)
pH: 6 (ref 5.0–8.0)

## 2020-03-19 MED ORDER — ONDANSETRON 4 MG PO TBDP: 4.0000 mg | ORAL_TABLET | Freq: Three times a day (TID) | ORAL | 0 refills | Status: DC | PRN

## 2020-03-19 MED ORDER — SODIUM CHLORIDE 0.9 % IV BOLUS
1000.0000 mL | Freq: Once | INTRAVENOUS | Status: AC
  Administered 2020-03-19: 1000 mL via INTRAVENOUS

## 2020-03-19 MED ORDER — CLONIDINE HCL 0.1 MG PO TABS
0.1000 mg | ORAL_TABLET | Freq: Once | ORAL | Status: AC
  Administered 2020-03-19: 12:00:00 0.1 mg via ORAL
  Filled 2020-03-19: qty 1

## 2020-03-19 MED ORDER — METOCLOPRAMIDE HCL 5 MG/ML IJ SOLN
10.0000 mg | Freq: Once | INTRAMUSCULAR | Status: AC
  Administered 2020-03-19: 10 mg via INTRAVENOUS
  Filled 2020-03-19: qty 2

## 2020-03-19 MED ORDER — FENTANYL CITRATE (PF) 100 MCG/2ML IJ SOLN
50.0000 ug | Freq: Once | INTRAMUSCULAR | Status: AC
  Administered 2020-03-19: 50 ug via INTRAVENOUS
  Filled 2020-03-19: qty 2

## 2020-03-19 MED ORDER — AMLODIPINE BESYLATE 5 MG PO TABS: 5.0000 mg | ORAL_TABLET | Freq: Every day | ORAL | 0 refills | Status: DC

## 2020-03-19 MED ORDER — DIPHENHYDRAMINE HCL 50 MG/ML IJ SOLN
12.5000 mg | Freq: Once | INTRAMUSCULAR | Status: AC
  Administered 2020-03-19: 12.5 mg via INTRAVENOUS
  Filled 2020-03-19: qty 1

## 2020-03-19 NOTE — ED Triage Notes (Signed)
Pt arrives via [facility] with a c/o of hypertension with a headache and n/v. Pt was also seen here yesterday for same.

## 2020-03-19 NOTE — ED Notes (Signed)
Pt verbalized understanding of discharge instructions. Prescriptions and follow up care reviewed, pt had no further questions. Ambulated independently to lobby. 
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
## 2020-03-19 NOTE — Discharge Instructions (Signed)
You were seen in the emergency department today for a headache.  Your work-up was overall reassuring.  Your labs show that your potassium is a bit low, please see attached guidelines for diet recommendations to help with this.  We are sending you home with a prescription for amlodipine, and medication for blood pressure, we have given a coupon if you pick this up at a [employer] it will cost a little over $6.  Also sending home with a prescription for Zofran to take every 8 hours as needed for nausea and vomiting.  Please take Motrin and or Tylenol per over-the-counter dosing to help with pain.  We have scheduled you a follow-up appointment with a family medicine clinic to establish primary care.  Return to the ER for new or worsening symptoms including but not limited to increased pain, numbness, weakness, chest pain, trouble breathing, inability to keep fluids down, fever, or any other concerns.

## 2020-04-09 ENCOUNTER — Inpatient Hospital Stay (INDEPENDENT_AMBULATORY_CARE_PROVIDER_SITE_OTHER): Payer: Self-pay | Admitting: Primary Care

## 2020-05-25 ENCOUNTER — Emergency Department (HOSPITAL_COMMUNITY)
Admission: EM | Admit: 2020-05-25 | Discharge: 2020-05-25 | Disposition: A | Discharge status: Home / Self Care | Payer: Self-pay | Attending: Emergency Medicine | Admitting: Emergency Medicine

## 2020-05-25 ENCOUNTER — Other Ambulatory Visit: Payer: Self-pay

## 2020-05-25 DIAGNOSIS — Z79899 Other long term (current) drug therapy: Secondary | ICD-10-CM | POA: Insufficient documentation

## 2020-05-25 DIAGNOSIS — R112 Nausea with vomiting, unspecified: Secondary | ICD-10-CM | POA: Insufficient documentation

## 2020-05-25 DIAGNOSIS — R1013 Epigastric pain: Secondary | ICD-10-CM | POA: Insufficient documentation

## 2020-05-25 DIAGNOSIS — F17210 Nicotine dependence, cigarettes, uncomplicated: Secondary | ICD-10-CM | POA: Insufficient documentation

## 2020-05-25 DIAGNOSIS — I10 Essential (primary) hypertension: Secondary | ICD-10-CM | POA: Insufficient documentation

## 2020-05-25 DIAGNOSIS — R1033 Periumbilical pain: Secondary | ICD-10-CM | POA: Insufficient documentation

## 2020-05-25 LAB — CBC WITH DIFFERENTIAL/PLATELET
Abs Immature Granulocytes: 0.04 10*3/uL (ref 0.00–0.07)
Basophils Absolute: 0 10*3/uL (ref 0.0–0.1)
Basophils Relative: 0 %
Eosinophils Absolute: 0 10*3/uL (ref 0.0–0.5)
Eosinophils Relative: 0 %
HCT: 48 % (ref 39.0–52.0)
Hemoglobin: 15.9 g/dL (ref 13.0–17.0)
Immature Granulocytes: 0 %
Lymphocytes Relative: 12 %
Lymphs Abs: 1.3 10*3/uL (ref 0.7–4.0)
MCH: 30.1 pg (ref 26.0–34.0)
MCHC: 33.1 g/dL (ref 30.0–36.0)
MCV: 90.7 fL (ref 80.0–100.0)
Monocytes Absolute: 0.3 10*3/uL (ref 0.1–1.0)
Monocytes Relative: 3 %
Neutro Abs: 8.8 10*3/uL — ABNORMAL HIGH (ref 1.7–7.7)
Neutrophils Relative %: 85 %
Platelets: 262 10*3/uL (ref 150–400)
RBC: 5.29 MIL/uL (ref 4.22–5.81)
RDW: 12.3 % (ref 11.5–15.5)
WBC: 10.4 10*3/uL (ref 4.0–10.5)
nRBC: 0 % (ref 0.0–0.2)

## 2020-05-25 LAB — COMPREHENSIVE METABOLIC PANEL
ALT: 28 U/L (ref 0–44)
AST: 31 U/L (ref 15–41)
Albumin: 4.6 g/dL (ref 3.5–5.0)
Alkaline Phosphatase: 115 U/L (ref 38–126)
Anion gap: 14 (ref 5–15)
BUN: 9 mg/dL (ref 6–20)
CO2: 21 mmol/L — ABNORMAL LOW (ref 22–32)
Calcium: 9.4 mg/dL (ref 8.9–10.3)
Chloride: 104 mmol/L (ref 98–111)
Creatinine, Ser: 0.89 mg/dL (ref 0.61–1.24)
GFR calc Af Amer: >60 mL/min (ref >60)
GFR calc non Af Amer: >60 mL/min (ref >60)
Glucose, Bld: 103 mg/dL — ABNORMAL HIGH (ref 70–99)
Potassium: 4.3 mmol/L (ref 3.5–5.1)
Sodium: 139 mmol/L (ref 135–145)
Total Bilirubin: 0.5 mg/dL (ref 0.3–1.2)
Total Protein: 7.9 g/dL (ref 6.5–8.1)

## 2020-05-25 LAB — LIPASE, BLOOD: Lipase: 23 U/L (ref 11–51)

## 2020-05-25 MED ORDER — HALOPERIDOL LACTATE 5 MG/ML IJ SOLN
5.0000 mg | Freq: Once | INTRAMUSCULAR | Status: AC
  Administered 2020-05-25: 5 mg via INTRAVENOUS
  Filled 2020-05-25: qty 1

## 2020-05-25 MED ORDER — LACTATED RINGERS IV BOLUS
1000.0000 mL | Freq: Once | INTRAVENOUS | Status: AC
  Administered 2020-05-25: 1000 mL via INTRAVENOUS

## 2020-05-25 MED ORDER — AMLODIPINE BESYLATE 5 MG PO TABS: 5.0000 mg | ORAL_TABLET | Freq: Every day | ORAL | 3 refills | Status: AC

## 2020-05-25 NOTE — ED Notes (Signed)
IV access attempted X1, unsuccessful. Pt unable to remain still.

## 2020-05-25 NOTE — Discharge Instructions (Signed)
The nausea and vomiting may be related to marijuana use and it might help if you stop smoking marijuana.  All of your lab work today looks normal.  However your blood pressure is also elevated today and it would be recommended that you start back on your blood pressure medication and new prescription was sent to your pharmacy.  Also if you do not have a regular doctor you can call the number above to try to get a regular doctor that can continually treat your blood pressure.
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
## 2020-05-25 NOTE — ED Notes (Signed)
Pt woke up and went to the restroom. Pt asked how he was doing. This RN gave pt water and crackers and stated if he could eat and drink we would let him go home shortly.

## 2020-05-25 NOTE — ED Triage Notes (Signed)
Per [facility] pt from home presents with N/VD x12 hours. Opiate withdrawal, prescribed oxy and hydrocodone, has not had opiates in a while. Non compliant with BP meds.   HR 60 BP 220/130 98% RA  RR 22

## 2020-07-27 ENCOUNTER — Other Ambulatory Visit: Payer: Self-pay

## 2020-07-27 ENCOUNTER — Emergency Department (HOSPITAL_COMMUNITY)
Admission: EM | Admit: 2020-07-27 | Discharge: 2020-07-27 | Disposition: A | Discharge status: Home / Self Care | Payer: Self-pay | Attending: Emergency Medicine | Admitting: Emergency Medicine

## 2020-07-27 ENCOUNTER — Encounter (HOSPITAL_COMMUNITY): Payer: Self-pay

## 2020-07-27 DIAGNOSIS — I10 Essential (primary) hypertension: Secondary | ICD-10-CM | POA: Insufficient documentation

## 2020-07-27 DIAGNOSIS — R519 Headache, unspecified: Secondary | ICD-10-CM | POA: Insufficient documentation

## 2020-07-27 DIAGNOSIS — Z5321 Procedure and treatment not carried out due to patient leaving prior to being seen by health care provider: Secondary | ICD-10-CM | POA: Insufficient documentation

## 2020-07-27 MED ORDER — OXYCODONE-ACETAMINOPHEN 5-325 MG PO TABS
1.0000 | ORAL_TABLET | ORAL | Status: DC | PRN
  Administered 2020-07-27: 1 via ORAL
  Filled 2020-07-27: qty 1

## 2020-07-27 NOTE — ED Notes (Signed)
Pt called for vitals with no response x1 

## 2020-07-27 NOTE — ED Notes (Addendum)
Pt called to be recess with no response

## 2020-07-27 NOTE — ED Notes (Signed)
Pt witnessed leaving ER

## 2020-07-27 NOTE — ED Notes (Signed)
Called patient several times patient didn't answer  

## 2020-07-27 NOTE — ED Notes (Signed)
Pt called for vitals with no response x2 °

## 2020-07-27 NOTE — ED Triage Notes (Signed)
Pt BIB [facility] with headache 4 days. Pt is unable to take his BP meds d/t financial concerns.   Pt states he has not taken his meds in a "very long time" and unable to sleep x2 days d/t pain.   BP 180/118 CBG 90 96% RA  HR 60

## 2020-07-27 NOTE — ED Notes (Signed)
Pt called for vitals with no response x3. 

## 2020-10-30 ENCOUNTER — Emergency Department (HOSPITAL_COMMUNITY)
Admission: EM | Admit: 2020-10-30 | Discharge: 2020-10-30 | Disposition: A | Discharge status: Home / Self Care | Payer: Self-pay | Attending: Emergency Medicine | Admitting: Emergency Medicine

## 2020-10-30 ENCOUNTER — Other Ambulatory Visit: Payer: Self-pay

## 2020-10-30 ENCOUNTER — Encounter (HOSPITAL_COMMUNITY): Payer: Self-pay | Admitting: Emergency Medicine

## 2020-10-30 DIAGNOSIS — Z5321 Procedure and treatment not carried out due to patient leaving prior to being seen by health care provider: Secondary | ICD-10-CM | POA: Insufficient documentation

## 2020-10-30 DIAGNOSIS — R1030 Lower abdominal pain, unspecified: Secondary | ICD-10-CM | POA: Insufficient documentation

## 2020-10-30 DIAGNOSIS — R519 Headache, unspecified: Secondary | ICD-10-CM | POA: Insufficient documentation

## 2020-10-30 DIAGNOSIS — R112 Nausea with vomiting, unspecified: Secondary | ICD-10-CM | POA: Insufficient documentation

## 2020-10-30 LAB — COMPREHENSIVE METABOLIC PANEL
ALT: 31 U/L (ref 0–44)
AST: 32 U/L (ref 15–41)
Albumin: 4.5 g/dL (ref 3.5–5.0)
Alkaline Phosphatase: 86 U/L (ref 38–126)
Anion gap: 15 (ref 5–15)
BUN: 8 mg/dL (ref 6–20)
CO2: 22 mmol/L (ref 22–32)
Calcium: 9.5 mg/dL (ref 8.9–10.3)
Chloride: 96 mmol/L — ABNORMAL LOW (ref 98–111)
Creatinine, Ser: 1 mg/dL (ref 0.61–1.24)
GFR, Estimated: >60 mL/min (ref >60)
Glucose, Bld: 103 mg/dL — ABNORMAL HIGH (ref 70–99)
Potassium: 3.3 mmol/L — ABNORMAL LOW (ref 3.5–5.1)
Sodium: 133 mmol/L — ABNORMAL LOW (ref 135–145)
Total Bilirubin: 0.6 mg/dL (ref 0.3–1.2)
Total Protein: 8 g/dL (ref 6.5–8.1)

## 2020-10-30 LAB — CBC
HCT: 49 % (ref 39.0–52.0)
Hemoglobin: 16.6 g/dL (ref 13.0–17.0)
MCH: 30.5 pg (ref 26.0–34.0)
MCHC: 33.9 g/dL (ref 30.0–36.0)
MCV: 89.9 fL (ref 80.0–100.0)
Platelets: 285 10*3/uL (ref 150–400)
RBC: 5.45 MIL/uL (ref 4.22–5.81)
RDW: 11.9 % (ref 11.5–15.5)
WBC: 10.3 10*3/uL (ref 4.0–10.5)
nRBC: 0 % (ref 0.0–0.2)

## 2020-10-30 LAB — LIPASE, BLOOD: Lipase: 47 U/L (ref 11–51)

## 2020-10-30 NOTE — ED Notes (Signed)
Patient advised he is going to leave. Stated that he already called a ride to come get him. Pt encouraged to stay, and made aware that he will be going back to a room to be seen by a provider very soon. Pt refused, statin he wanted to go home and lay down. Demanding iv be removed from his hand at this time.

## 2020-10-30 NOTE — ED Triage Notes (Addendum)
Patient arrives to ED with complaints of nausea and vomiting x2 days. Pt states lower abdominal pain as well. Pain described as burning and constant. States around x20 episodes of emesis, color is green/yellow. Sharp headache x1 day. Pt received 4mg  zofran IV and 250cc NS via EMS.
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
## 2021-01-03 ENCOUNTER — Encounter (HOSPITAL_COMMUNITY): Payer: Self-pay | Admitting: Emergency Medicine

## 2021-01-03 ENCOUNTER — Emergency Department (HOSPITAL_COMMUNITY): Payer: Self-pay

## 2021-01-03 ENCOUNTER — Emergency Department (HOSPITAL_COMMUNITY)
Admission: EM | Admit: 2021-01-03 | Discharge: 2021-01-03 | Disposition: A | Discharge status: Home / Self Care | Payer: Self-pay | Attending: Emergency Medicine | Admitting: Emergency Medicine

## 2021-01-03 DIAGNOSIS — F17210 Nicotine dependence, cigarettes, uncomplicated: Secondary | ICD-10-CM | POA: Insufficient documentation

## 2021-01-03 DIAGNOSIS — G8929 Other chronic pain: Secondary | ICD-10-CM | POA: Insufficient documentation

## 2021-01-03 DIAGNOSIS — I10 Essential (primary) hypertension: Secondary | ICD-10-CM | POA: Insufficient documentation

## 2021-01-03 DIAGNOSIS — Z79899 Other long term (current) drug therapy: Secondary | ICD-10-CM | POA: Insufficient documentation

## 2021-01-03 DIAGNOSIS — M79652 Pain in left thigh: Secondary | ICD-10-CM | POA: Insufficient documentation

## 2021-01-03 DIAGNOSIS — B349 Viral infection, unspecified: Secondary | ICD-10-CM | POA: Insufficient documentation

## 2021-01-03 DIAGNOSIS — Z20822 Contact with and (suspected) exposure to covid-19: Secondary | ICD-10-CM | POA: Insufficient documentation

## 2021-01-03 LAB — COMPREHENSIVE METABOLIC PANEL
ALT: 33 U/L (ref 0–44)
AST: 31 U/L (ref 15–41)
Albumin: 4.2 g/dL (ref 3.5–5.0)
Alkaline Phosphatase: 91 U/L (ref 38–126)
Anion gap: 12 (ref 5–15)
BUN: 7 mg/dL (ref 6–20)
CO2: 21 mmol/L — ABNORMAL LOW (ref 22–32)
Calcium: 9.4 mg/dL (ref 8.9–10.3)
Chloride: 106 mmol/L (ref 98–111)
Creatinine, Ser: 1.09 mg/dL (ref 0.61–1.24)
GFR, Estimated: >60 mL/min (ref >60)
Glucose, Bld: 109 mg/dL — ABNORMAL HIGH (ref 70–99)
Potassium: 3.7 mmol/L (ref 3.5–5.1)
Sodium: 139 mmol/L (ref 135–145)
Total Bilirubin: 0.6 mg/dL (ref 0.3–1.2)
Total Protein: 7.8 g/dL (ref 6.5–8.1)

## 2021-01-03 LAB — CBC WITH DIFFERENTIAL/PLATELET
Abs Immature Granulocytes: 0.05 10*3/uL (ref 0.00–0.07)
Basophils Absolute: 0 10*3/uL (ref 0.0–0.1)
Basophils Relative: 0 %
Eosinophils Absolute: 0.1 10*3/uL (ref 0.0–0.5)
Eosinophils Relative: 1 %
HCT: 47.5 % (ref 39.0–52.0)
Hemoglobin: 16.2 g/dL (ref 13.0–17.0)
Immature Granulocytes: 0 %
Lymphocytes Relative: 13 %
Lymphs Abs: 1.5 10*3/uL (ref 0.7–4.0)
MCH: 31.3 pg (ref 26.0–34.0)
MCHC: 34.1 g/dL (ref 30.0–36.0)
MCV: 91.9 fL (ref 80.0–100.0)
Monocytes Absolute: 0.4 10*3/uL (ref 0.1–1.0)
Monocytes Relative: 3 %
Neutro Abs: 9.1 10*3/uL — ABNORMAL HIGH (ref 1.7–7.7)
Neutrophils Relative %: 83 %
Platelets: 299 10*3/uL (ref 150–400)
RBC: 5.17 MIL/uL (ref 4.22–5.81)
RDW: 11.9 % (ref 11.5–15.5)
WBC: 11.1 10*3/uL — ABNORMAL HIGH (ref 4.0–10.5)
nRBC: 0 % (ref 0.0–0.2)

## 2021-01-03 LAB — SARS CORONAVIRUS 2 (TAT 6-24 HRS): SARS Coronavirus 2: NEGATIVE

## 2021-01-03 LAB — LIPASE, BLOOD: Lipase: 26 U/L (ref 11–51)

## 2021-01-03 MED ORDER — DROPERIDOL 2.5 MG/ML IJ SOLN
1.2500 mg | Freq: Once | INTRAMUSCULAR | Status: AC
  Administered 2021-01-03: 1.25 mg via INTRAVENOUS
  Filled 2021-01-03: qty 2

## 2021-01-03 MED ORDER — KETOROLAC TROMETHAMINE 15 MG/ML IJ SOLN
15.0000 mg | Freq: Once | INTRAMUSCULAR | Status: AC
  Administered 2021-01-03: 15 mg via INTRAVENOUS
  Filled 2021-01-03: qty 1

## 2021-01-03 MED ORDER — DIPHENHYDRAMINE HCL 50 MG/ML IJ SOLN
25.0000 mg | Freq: Once | INTRAMUSCULAR | Status: AC
  Administered 2021-01-03: 25 mg via INTRAVENOUS
  Filled 2021-01-03: qty 1

## 2021-01-03 MED ORDER — MORPHINE SULFATE (PF) 4 MG/ML IV SOLN
4.0000 mg | Freq: Once | INTRAVENOUS | Status: AC
  Administered 2021-01-03: 4 mg via INTRAVENOUS
  Filled 2021-01-03: qty 1

## 2021-01-03 MED ORDER — PROMETHAZINE HCL 25 MG RE SUPP: 25.0000 mg | Freq: Four times a day (QID) | RECTAL | 0 refills | Status: DC | PRN

## 2021-01-03 MED ORDER — SODIUM CHLORIDE 0.9 % IV BOLUS
1000.0000 mL | Freq: Once | INTRAVENOUS | Status: AC
  Administered 2021-01-03: 1000 mL via INTRAVENOUS

## 2021-01-03 MED ORDER — ONDANSETRON 4 MG PO TBDP
ORAL_TABLET | ORAL | 0 refills | Status: DC
Start: 2021-01-03 — End: 2021-05-21
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
## 2021-01-03 NOTE — Discharge Instructions (Signed)
Take tylenol 2 pills 4 times a day and motrin 4 pills 3 times a day.  Drink plenty of fluids.  Return for worsening shortness of breath, headache, confusion. Follow up with your family doctor.   

## 2021-01-03 NOTE — ED Notes (Signed)
Please call mom Harrington Jobe @ [phone] for a status update

## 2021-01-03 NOTE — ED Notes (Signed)
MD aware of BP. Pt states "Ill take my medicine when I get home"
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
## 2021-03-05 ENCOUNTER — Emergency Department (HOSPITAL_COMMUNITY)
Admission: EM | Admit: 2021-03-05 | Discharge: 2021-03-05 | Disposition: A | Discharge status: Home / Self Care | Payer: Self-pay | Attending: Emergency Medicine | Admitting: Emergency Medicine

## 2021-03-05 DIAGNOSIS — R109 Unspecified abdominal pain: Secondary | ICD-10-CM | POA: Insufficient documentation

## 2021-03-05 DIAGNOSIS — Z5321 Procedure and treatment not carried out due to patient leaving prior to being seen by health care provider: Secondary | ICD-10-CM | POA: Insufficient documentation

## 2021-03-05 DIAGNOSIS — R197 Diarrhea, unspecified: Secondary | ICD-10-CM | POA: Insufficient documentation

## 2021-03-05 DIAGNOSIS — R112 Nausea with vomiting, unspecified: Secondary | ICD-10-CM | POA: Insufficient documentation

## 2021-03-05 NOTE — ED Notes (Signed)
Pt is diaphoretic, assisted to bathroom per wheelchair.

## 2021-03-05 NOTE — ED Triage Notes (Signed)
Nausea, vomiting, diarrhea, abdominal pain x 5 hours. Brought in by [facility].

## 2021-03-05 NOTE — ED Notes (Signed)
Pt walked out of bathroom and heading towards ED exit. Called this pt's name several times to come back and be evaluated and triaged. Pt states "nah, my momma's out there, im good". Ambulatory with steady gait.

## 2021-05-21 ENCOUNTER — Encounter (HOSPITAL_COMMUNITY): Payer: Self-pay | Admitting: Emergency Medicine

## 2021-05-21 ENCOUNTER — Ambulatory Visit (HOSPITAL_COMMUNITY)
Admission: EM | Admit: 2021-05-21 | Discharge: 2021-05-21 | Disposition: A | Discharge status: Home / Self Care | Payer: Self-pay | Attending: Urgent Care | Admitting: Urgent Care

## 2021-05-21 ENCOUNTER — Other Ambulatory Visit: Payer: Self-pay

## 2021-05-21 DIAGNOSIS — F1290 Cannabis use, unspecified, uncomplicated: Secondary | ICD-10-CM

## 2021-05-21 DIAGNOSIS — R112 Nausea with vomiting, unspecified: Secondary | ICD-10-CM

## 2021-05-21 MED ORDER — ONDANSETRON 8 MG PO TBDP: 8.0000 mg | ORAL_TABLET | Freq: Three times a day (TID) | ORAL | 0 refills | Status: DC | PRN

## 2021-05-21 MED ORDER — KETOROLAC TROMETHAMINE 60 MG/2ML IM SOLN
INTRAMUSCULAR | Status: AC
  Filled 2021-05-21: qty 2

## 2021-05-21 MED ORDER — KETOROLAC TROMETHAMINE 60 MG/2ML IM SOLN
60.0000 mg | Freq: Once | INTRAMUSCULAR | Status: AC
  Administered 2021-05-21: 60 mg via INTRAMUSCULAR

## 2021-05-21 MED ORDER — ONDANSETRON HCL 4 MG/2ML IJ SOLN
INTRAMUSCULAR | Status: AC
  Filled 2021-05-21: qty 2

## 2021-05-21 MED ORDER — ONDANSETRON HCL 4 MG/2ML IJ SOLN
4.0000 mg | Freq: Once | INTRAMUSCULAR | Status: AC
  Administered 2021-05-21: 4 mg via INTRAMUSCULAR
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
## 2021-05-21 NOTE — Discharge Instructions (Addendum)
Make sure you push fluids drinking mostly water but mix it with Gatorade.  Try to eat light meals including soups, broths and soft foods, fruits.  You may use Zofran for your nausea and vomiting once every 8 hours.   Please return to the clinic if symptoms worsen or you start having severe abdominal pain not helped by taking Tylenol or start having bloody stools or blood in the vomit.  

## 2021-07-09 ENCOUNTER — Other Ambulatory Visit: Payer: Self-pay

## 2021-07-09 ENCOUNTER — Encounter (HOSPITAL_COMMUNITY): Payer: Self-pay | Admitting: Emergency Medicine

## 2021-07-09 ENCOUNTER — Emergency Department (HOSPITAL_COMMUNITY)
Admission: EM | Admit: 2021-07-09 | Discharge: 2021-07-09 | Disposition: A | Discharge status: Home / Self Care | Payer: Self-pay | Attending: Emergency Medicine | Admitting: Emergency Medicine

## 2021-07-09 DIAGNOSIS — Z79899 Other long term (current) drug therapy: Secondary | ICD-10-CM | POA: Insufficient documentation

## 2021-07-09 DIAGNOSIS — F1010 Alcohol abuse, uncomplicated: Secondary | ICD-10-CM

## 2021-07-09 DIAGNOSIS — Y902 Blood alcohol level of 40-59 mg/100 ml: Secondary | ICD-10-CM | POA: Insufficient documentation

## 2021-07-09 DIAGNOSIS — F1420 Cocaine dependence, uncomplicated: Secondary | ICD-10-CM | POA: Insufficient documentation

## 2021-07-09 DIAGNOSIS — T5192XA Toxic effect of unspecified alcohol, intentional self-harm, initial encounter: Secondary | ICD-10-CM | POA: Insufficient documentation

## 2021-07-09 DIAGNOSIS — F17210 Nicotine dependence, cigarettes, uncomplicated: Secondary | ICD-10-CM | POA: Insufficient documentation

## 2021-07-09 DIAGNOSIS — I10 Essential (primary) hypertension: Secondary | ICD-10-CM | POA: Insufficient documentation

## 2021-07-09 DIAGNOSIS — F1024 Alcohol dependence with alcohol-induced mood disorder: Secondary | ICD-10-CM | POA: Insufficient documentation

## 2021-07-09 DIAGNOSIS — F10920 Alcohol use, unspecified with intoxication, uncomplicated: Secondary | ICD-10-CM

## 2021-07-09 DIAGNOSIS — Z20822 Contact with and (suspected) exposure to covid-19: Secondary | ICD-10-CM | POA: Insufficient documentation

## 2021-07-09 LAB — COMPREHENSIVE METABOLIC PANEL
ALT: 26 U/L (ref 0–44)
AST: 26 U/L (ref 15–41)
Albumin: 4.3 g/dL (ref 3.5–5.0)
Alkaline Phosphatase: 92 U/L (ref 38–126)
Anion gap: 13 (ref 5–15)
BUN: 17 mg/dL (ref 6–20)
CO2: 20 mmol/L — ABNORMAL LOW (ref 22–32)
Calcium: 9.2 mg/dL (ref 8.9–10.3)
Chloride: 104 mmol/L (ref 98–111)
Creatinine, Ser: 1.1 mg/dL (ref 0.61–1.24)
GFR, Estimated: >60 mL/min (ref >60)
Glucose, Bld: 89 mg/dL (ref 70–99)
Potassium: 3.7 mmol/L (ref 3.5–5.1)
Sodium: 137 mmol/L (ref 135–145)
Total Bilirubin: 0.6 mg/dL (ref 0.3–1.2)
Total Protein: 7.8 g/dL (ref 6.5–8.1)

## 2021-07-09 LAB — CBC WITH DIFFERENTIAL/PLATELET
Abs Immature Granulocytes: 0.07 K/uL (ref 0.00–0.07)
Basophils Absolute: 0 K/uL (ref 0.0–0.1)
Basophils Relative: 0 %
Eosinophils Absolute: 0.1 K/uL (ref 0.0–0.5)
Eosinophils Relative: 1 %
HCT: 45.7 % (ref 39.0–52.0)
Hemoglobin: 15.7 g/dL (ref 13.0–17.0)
Immature Granulocytes: 1 %
Lymphocytes Relative: 13 %
Lymphs Abs: 1.8 K/uL (ref 0.7–4.0)
MCH: 32 pg (ref 26.0–34.0)
MCHC: 34.4 g/dL (ref 30.0–36.0)
MCV: 93.1 fL (ref 80.0–100.0)
Monocytes Absolute: 0.5 K/uL (ref 0.1–1.0)
Monocytes Relative: 3 %
Neutro Abs: 11.9 K/uL — ABNORMAL HIGH (ref 1.7–7.7)
Neutrophils Relative %: 82 %
Platelets: 296 K/uL (ref 150–400)
RBC: 4.91 MIL/uL (ref 4.22–5.81)
RDW: 11.9 % (ref 11.5–15.5)
WBC: 14.4 K/uL — ABNORMAL HIGH (ref 4.0–10.5)
nRBC: 0 % (ref 0.0–0.2)

## 2021-07-09 LAB — RESP PANEL BY RT-PCR (FLU A&B, COVID) ARPGX2
Influenza A by PCR: NEGATIVE
Influenza B by PCR: NEGATIVE
SARS Coronavirus 2 by RT PCR: NEGATIVE

## 2021-07-09 LAB — RAPID URINE DRUG SCREEN, HOSP PERFORMED
Amphetamines: NOT DETECTED
Barbiturates: NOT DETECTED
Benzodiazepines: NOT DETECTED
Cocaine: POSITIVE — AB
Opiates: NOT DETECTED
Tetrahydrocannabinol: POSITIVE — AB

## 2021-07-09 LAB — SALICYLATE LEVEL: Salicylate Lvl: <7 mg/dL — ABNORMAL LOW (ref 7.0–30.0)

## 2021-07-09 LAB — ACETAMINOPHEN LEVEL: Acetaminophen (Tylenol), Serum: <10 ug/mL — ABNORMAL LOW (ref 10–30)

## 2021-07-09 LAB — ETHANOL: Alcohol, Ethyl (B): 40 mg/dL — ABNORMAL HIGH

## 2021-07-09 MED ORDER — FAMOTIDINE IN NACL 20-0.9 MG/50ML-% IV SOLN
20.0000 mg | Freq: Once | INTRAVENOUS | Status: AC
  Administered 2021-07-09: 20 mg via INTRAVENOUS
  Filled 2021-07-09: qty 50

## 2021-07-09 MED ORDER — ONDANSETRON HCL 4 MG/2ML IJ SOLN
4.0000 mg | Freq: Once | INTRAMUSCULAR | Status: AC
  Administered 2021-07-09: 4 mg via INTRAVENOUS
  Filled 2021-07-09: qty 2

## 2021-07-09 MED ORDER — AMLODIPINE BESYLATE 5 MG PO TABS
5.0000 mg | ORAL_TABLET | Freq: Every day | ORAL | Status: DC
  Administered 2021-07-09: 5 mg via ORAL
  Filled 2021-07-09: qty 1

## 2021-07-09 MED ORDER — LORAZEPAM 1 MG PO TABS: 0.0000 mg | ORAL_TABLET | Freq: Two times a day (BID) | ORAL | Status: DC

## 2021-07-09 MED ORDER — LORAZEPAM 2 MG/ML IJ SOLN
1.0000 mg | Freq: Once | INTRAMUSCULAR | Status: AC
  Administered 2021-07-09: 1 mg via INTRAVENOUS
  Filled 2021-07-09: qty 1

## 2021-07-09 MED ORDER — THIAMINE HCL 100 MG PO TABS
100.0000 mg | ORAL_TABLET | Freq: Every day | ORAL | Status: DC
  Administered 2021-07-09: 100 mg via ORAL
  Filled 2021-07-09 (×2): qty 1

## 2021-07-09 MED ORDER — LORAZEPAM 1 MG PO TABS
0.0000 mg | ORAL_TABLET | Freq: Four times a day (QID) | ORAL | Status: DC
  Administered 2021-07-09: 1 mg via ORAL
  Filled 2021-07-09: qty 1

## 2021-07-09 MED ORDER — NICOTINE 21 MG/24HR TD PT24
21.0000 mg | MEDICATED_PATCH | Freq: Every day | TRANSDERMAL | Status: DC
Start: 2021-07-09 — End: 2021-07-09
  Administered 2021-07-09: 21 mg via TRANSDERMAL
  Filled 2021-07-09 (×2): qty 1

## 2021-07-09 MED ORDER — ONDANSETRON 4 MG PO TBDP
4.0000 mg | ORAL_TABLET | Freq: Once | ORAL | Status: AC
  Administered 2021-07-09: 4 mg via ORAL
  Filled 2021-07-09: qty 1

## 2021-07-09 MED ORDER — SODIUM CHLORIDE 0.9 % IV BOLUS
1000.0000 mL | Freq: Once | INTRAVENOUS | Status: AC
  Administered 2021-07-09: 1000 mL via INTRAVENOUS

## 2021-07-09 MED ORDER — ACETAMINOPHEN 325 MG PO TABS
650.0000 mg | ORAL_TABLET | Freq: Once | ORAL | Status: AC
  Administered 2021-07-09: 650 mg via ORAL
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
## 2021-07-09 NOTE — ED Provider Notes (Signed)
  Physical Exam  BP (!) 176/95 (BP Location: Left Arm)   Pulse 67   Temp 98.8 F (37.1 C) (Oral)   Resp 16   Ht 5\' 11"  (1.803 m)   Wt 83.9 kg   SpO2 98%   BMI 25.80 kg/m   Physical Exam Vitals and nursing note reviewed.  Constitutional:      General: He is not in acute distress.    Appearance: He is well-developed.  HENT:     Head: Normocephalic and atraumatic.     Right Ear: External ear normal.     Left Ear: External ear normal.     Mouth/Throat:     Mouth: Mucous membranes are moist.  Eyes:     General: No scleral icterus. Cardiovascular:     Rate and Rhythm: Normal rate and regular rhythm.     Pulses: Normal pulses.     Heart sounds: Normal heart sounds.  Pulmonary:     Effort: Pulmonary effort is normal. No respiratory distress.     Breath sounds: Normal breath sounds.  Abdominal:     General: Abdomen is flat.     Palpations: Abdomen is soft.     Tenderness: There is no abdominal tenderness.  Musculoskeletal:        General: Normal range of motion.     Cervical back: Normal range of motion.     Right lower leg: No edema.     Left lower leg: No edema.  Skin:    General: Skin is warm and dry.     Capillary Refill: Capillary refill takes less than 2 seconds.  Neurological:     Mental Status: He is alert and oriented to person, place, and time.  Psychiatric:        Mood and Affect: Mood normal.        Behavior: Behavior normal.        Thought Content: Thought content does not include homicidal or suicidal ideation. Thought content does not include homicidal or suicidal plan.    ED Course/Procedures   Clinical Course as of 07/09/21 1425  Tue Jul 09, 2021  0500 Patient evaluated by me and found to be vomiting and falling out of wheelchair.  [MR]  [phone] Patient sleeping while I attempted thorough PE [MR]    Clinical Course User Index [MR] Redwine, Madison A, PA-C    Procedures  MDM  Patient initially presented to ED for SI, etoh abuse. No acute distress,  vital signs are stable. Labs reviewed, see prior ED note. Pt pending TTS evaluation this afternoon which was completed. He is not suicidal, not acutely psychotic. He has been cleared by psychiatry for discharge. He is tolerating PO, he is ambulatory. Given outpatient f/u resources. Advised to refrain from ETOH use in the future. Stable for discharge at this time.       0349 A, DO 07/09/21 1425

## 2021-07-09 NOTE — BH Assessment (Signed)
TTS attempted to complete assessment at 0755. However, per Myrna Blazer, RN, "Pt is refusing until he gets Tylenol. I am trying to get him up to eat and working on getting him meds." RN will notify TTS when pt is ready for assessment.

## 2021-07-09 NOTE — ED Notes (Signed)
Pt being TTS at this time  
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
## 2021-07-09 NOTE — ED Notes (Signed)
Pt is resting at this time. Pt respirations 18, will get full set of vitals, once pt is woke

## 2021-07-09 NOTE — ED Notes (Addendum)
Pt received his belonging and d/c instructions reviewed. Pt refused VS Prior to d/c
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
## 2021-07-09 NOTE — ED Notes (Signed)
Breakfast Orders Placed °

## 2021-07-09 NOTE — ED Notes (Signed)
Pt called out saying he was bleeding. Upon entry into the room pt's IV had been removed. Pt stated "I didn't take it out." IV was intact at 0855 when this tech got pt's vitals. Pt is very squirmy and restless. This tech finished removing IV and the tape, and cleaned up the blood. RN was notified.

## 2021-10-30 ENCOUNTER — Encounter (HOSPITAL_COMMUNITY): Payer: Self-pay | Admitting: Registered Nurse

## 2021-10-30 ENCOUNTER — Other Ambulatory Visit (HOSPITAL_COMMUNITY)
Admission: EM | Admit: 2021-10-30 | Discharge: 2021-10-30 | Disposition: A | Discharge status: Home / Self Care | Payer: No Payment, Other | Attending: Registered Nurse | Admitting: Registered Nurse

## 2021-10-30 DIAGNOSIS — F1910 Other psychoactive substance abuse, uncomplicated: Secondary | ICD-10-CM | POA: Diagnosis present

## 2021-10-30 DIAGNOSIS — R45851 Suicidal ideations: Secondary | ICD-10-CM | POA: Diagnosis not present

## 2021-10-30 DIAGNOSIS — F1490 Cocaine use, unspecified, uncomplicated: Secondary | ICD-10-CM | POA: Diagnosis not present

## 2021-10-30 DIAGNOSIS — F1290 Cannabis use, unspecified, uncomplicated: Secondary | ICD-10-CM | POA: Diagnosis not present

## 2021-10-30 DIAGNOSIS — F1914 Other psychoactive substance abuse with psychoactive substance-induced mood disorder: Secondary | ICD-10-CM | POA: Insufficient documentation

## 2021-10-30 DIAGNOSIS — Z20822 Contact with and (suspected) exposure to covid-19: Secondary | ICD-10-CM | POA: Insufficient documentation

## 2021-10-30 DIAGNOSIS — F1994 Other psychoactive substance use, unspecified with psychoactive substance-induced mood disorder: Secondary | ICD-10-CM | POA: Diagnosis present

## 2021-10-30 LAB — ETHANOL: Alcohol, Ethyl (B): <10 mg/dL (ref <10)

## 2021-10-30 LAB — CBC WITH DIFFERENTIAL/PLATELET
Abs Immature Granulocytes: 0.04 10*3/uL (ref 0.00–0.07)
Basophils Absolute: 0 10*3/uL (ref 0.0–0.1)
Basophils Relative: 0 %
Eosinophils Absolute: 0 10*3/uL (ref 0.0–0.5)
Eosinophils Relative: 0 %
HCT: 47.7 % (ref 39.0–52.0)
Hemoglobin: 16.1 g/dL (ref 13.0–17.0)
Immature Granulocytes: 0 %
Lymphocytes Relative: 9 %
Lymphs Abs: 1.3 10*3/uL (ref 0.7–4.0)
MCH: 31.1 pg (ref 26.0–34.0)
MCHC: 33.8 g/dL (ref 30.0–36.0)
MCV: 92.3 fL (ref 80.0–100.0)
Monocytes Absolute: 0.4 10*3/uL (ref 0.1–1.0)
Monocytes Relative: 3 %
Neutro Abs: 13 10*3/uL — ABNORMAL HIGH (ref 1.7–7.7)
Neutrophils Relative %: 88 %
Platelets: 243 10*3/uL (ref 150–400)
RBC: 5.17 MIL/uL (ref 4.22–5.81)
RDW: 11.7 % (ref 11.5–15.5)
WBC: 14.8 10*3/uL — ABNORMAL HIGH (ref 4.0–10.5)
nRBC: 0 % (ref 0.0–0.2)

## 2021-10-30 LAB — URINALYSIS, ROUTINE W REFLEX MICROSCOPIC
Bilirubin Urine: NEGATIVE
Glucose, UA: NEGATIVE mg/dL
Ketones, ur: NEGATIVE mg/dL
Leukocytes,Ua: NEGATIVE
Nitrite: NEGATIVE
Protein, ur: NEGATIVE mg/dL
Specific Gravity, Urine: >1.03 — ABNORMAL HIGH (ref 1.005–1.030)
pH: 6 (ref 5.0–8.0)

## 2021-10-30 LAB — POCT URINE DRUG SCREEN - MANUAL ENTRY (I-SCREEN)
POC Amphetamine UR: NOT DETECTED
POC Buprenorphine (BUP): NOT DETECTED
POC Cocaine UR: POSITIVE — AB
POC Marijuana UR: POSITIVE — AB
POC Methadone UR: NOT DETECTED
POC Methamphetamine UR: NOT DETECTED
POC Morphine: NOT DETECTED
POC Oxazepam (BZO): NOT DETECTED
POC Oxycodone UR: NOT DETECTED
POC Secobarbital (BAR): NOT DETECTED

## 2021-10-30 LAB — URINALYSIS, MICROSCOPIC (REFLEX): Bacteria, UA: NONE SEEN

## 2021-10-30 LAB — COMPREHENSIVE METABOLIC PANEL
ALT: 28 U/L (ref 0–44)
AST: 28 U/L (ref 15–41)
Albumin: 4.6 g/dL (ref 3.5–5.0)
Alkaline Phosphatase: 101 U/L (ref 38–126)
Anion gap: 11 (ref 5–15)
BUN: 9 mg/dL (ref 6–20)
CO2: 21 mmol/L — ABNORMAL LOW (ref 22–32)
Calcium: 9.8 mg/dL (ref 8.9–10.3)
Chloride: 107 mmol/L (ref 98–111)
Creatinine, Ser: 0.88 mg/dL (ref 0.61–1.24)
GFR, Estimated: >60 mL/min (ref >60)
Glucose, Bld: 126 mg/dL — ABNORMAL HIGH (ref 70–99)
Potassium: 3.6 mmol/L (ref 3.5–5.1)
Sodium: 139 mmol/L (ref 135–145)
Total Bilirubin: 0.6 mg/dL (ref 0.3–1.2)
Total Protein: 7.8 g/dL (ref 6.5–8.1)

## 2021-10-30 LAB — LIPID PANEL
Cholesterol: 193 mg/dL (ref 0–200)
HDL: 66 mg/dL (ref >40)
LDL Cholesterol: 103 mg/dL — ABNORMAL HIGH (ref 0–99)
Total CHOL/HDL Ratio: 2.9 RATIO
Triglycerides: 120 mg/dL (ref <150)
VLDL: 24 mg/dL (ref 0–40)

## 2021-10-30 LAB — TSH: TSH: 1.167 u[IU]/mL (ref 0.350–4.500)

## 2021-10-30 LAB — MAGNESIUM: Magnesium: 2 mg/dL (ref 1.7–2.4)

## 2021-10-30 LAB — HIV ANTIBODY (ROUTINE TESTING W REFLEX): HIV Screen 4th Generation wRfx: NONREACTIVE

## 2021-10-30 LAB — RESP PANEL BY RT-PCR (FLU A&B, COVID) ARPGX2
Influenza A by PCR: NEGATIVE
Influenza B by PCR: NEGATIVE
SARS Coronavirus 2 by RT PCR: NEGATIVE

## 2021-10-30 LAB — POC SARS CORONAVIRUS 2 AG -  ED: SARS Coronavirus 2 Ag: NEGATIVE

## 2021-10-30 LAB — POC SARS CORONAVIRUS 2 AG: SARSCOV2ONAVIRUS 2 AG: NEGATIVE

## 2021-10-30 MED ORDER — ONDANSETRON 4 MG PO TBDP: 4.0000 mg | ORAL_TABLET | Freq: Three times a day (TID) | ORAL | Status: DC | PRN

## 2021-10-30 MED ORDER — METHOCARBAMOL 750 MG PO TABS: 750.0000 mg | ORAL_TABLET | Freq: Three times a day (TID) | ORAL | Status: DC | PRN

## 2021-10-30 MED ORDER — NAPROXEN 500 MG PO TABS: 500.0000 mg | ORAL_TABLET | Freq: Two times a day (BID) | ORAL | Status: DC | PRN

## 2021-10-30 MED ORDER — ACETAMINOPHEN 325 MG PO TABS: 650.0000 mg | ORAL_TABLET | Freq: Four times a day (QID) | ORAL | Status: DC | PRN

## 2021-10-30 MED ORDER — MAGNESIUM HYDROXIDE 400 MG/5ML PO SUSP: 30.0000 mL | Freq: Every day | ORAL | Status: DC | PRN

## 2021-10-30 MED ORDER — ALUM & MAG HYDROXIDE-SIMETH 200-200-20 MG/5ML PO SUSP
30.0000 mL | ORAL | Status: DC | PRN
Start: 2021-10-30 — End: 2021-10-30

## 2021-10-30 MED ORDER — GABAPENTIN 300 MG PO CAPS
ORAL_CAPSULE | ORAL | Status: AC
  Administered 2021-10-30: 300 mg via ORAL
  Filled 2021-10-30: qty 1

## 2021-10-30 MED ORDER — AMLODIPINE BESYLATE 5 MG PO TABS: 5.0000 mg | ORAL_TABLET | Freq: Every day | ORAL | Status: DC

## 2021-10-30 MED ORDER — CLONIDINE HCL 0.1 MG PO TABS: 0.1000 mg | ORAL_TABLET | Freq: Four times a day (QID) | ORAL | Status: DC

## 2021-10-30 MED ORDER — HYDROXYZINE HCL 25 MG PO TABS: 25.0000 mg | ORAL_TABLET | Freq: Three times a day (TID) | ORAL | Status: DC | PRN

## 2021-10-30 MED ORDER — GABAPENTIN 300 MG PO CAPS
300.0000 mg | ORAL_CAPSULE | Freq: Three times a day (TID) | ORAL | Status: DC
Start: 2021-10-30 — End: 2021-10-30

## 2021-10-30 MED ORDER — CLONIDINE HCL 0.1 MG PO TABS: 0.1000 mg | ORAL_TABLET | Freq: Every day | ORAL | Status: DC

## 2021-10-30 MED ORDER — LOPERAMIDE HCL 2 MG PO CAPS
2.0000 mg | ORAL_CAPSULE | ORAL | Status: DC | PRN
Start: 2021-10-30 — End: 2021-10-30

## 2021-10-30 MED ORDER — CLONIDINE HCL 0.1 MG PO TABS: 0.1000 mg | ORAL_TABLET | ORAL | Status: DC

## 2021-10-30 MED ORDER — GABAPENTIN 300 MG PO CAPS: 300.0000 mg | ORAL_CAPSULE | Freq: Three times a day (TID) | ORAL | Status: DC

## 2021-10-30 MED ORDER — LORAZEPAM 1 MG PO TABS: 1.0000 mg | ORAL_TABLET | Freq: Four times a day (QID) | ORAL | Status: DC | PRN

## 2021-10-30 MED ORDER — DICYCLOMINE HCL 20 MG PO TABS: 10.0000 mg | ORAL_TABLET | Freq: Four times a day (QID) | ORAL | Status: DC | PRN

## 2021-10-30 MED ORDER — LORAZEPAM 1 MG PO TABS
ORAL_TABLET | ORAL | Status: AC
  Administered 2021-10-30: 1 mg via ORAL
  Filled 2021-10-30: qty 1

## 2021-10-30 MED ORDER — HYDROXYZINE HCL 25 MG PO TABS
ORAL_TABLET | ORAL | Status: AC
  Filled 2021-10-30: qty 1

## 2021-10-30 MED ORDER — CLONIDINE HCL 0.1 MG PO TABS
ORAL_TABLET | ORAL | Status: AC
  Administered 2021-10-30: 0.1 mg via ORAL
  Filled 2021-10-30: qty 1

## 2021-10-30 NOTE — ED Notes (Signed)
Orders given for discharge per pt request. Continue to be restless on bed asking to leave. Safety maintained.
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
## 2021-10-30 NOTE — ED Notes (Signed)
Pt discharged to home with resources in hand for follow up treatment centers. Pt not receptive to learning. Refused to allow staff to recheck vitals. Pt escorted off unit by staff to lobby where pt's mother was waiting to pick him up. Safety maintained.
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
## 2021-10-30 NOTE — Discharge Instructions (Addendum)
Substance Abuse Resources  [facility] - Admissions are currently completed Monday through Friday at 8am; both appointments and walk-ins are accepted.  Any individual that is a [place] resident may present for a substance abuse screening and assessment for admission.  A person may be referred by numerous sources or self-refer.   Potential clients will be screened for medical necessity and appropriateness for the program.  Clients must meet criteria for high-intensity residential treatment services.  If clinically appropriate, a client will continue with the comprehensive clinical assessment and intake process, as well as enrollment in the [facility].   Address: [address] [place], [place] [address] Admin Hours: Mon-Fri 8AM to 5PM Center Hours: 24/7 Phone: [phone] Fax: [phone]   [facility] ([facility]) Facility Based Crisis:  These are 3 locations for services: Please call before arrival    Address: [address] [place], [place] [address] Phone: [phone]   Address: [address], [place], [place] [address] Phone#: [phone]   Address: [address], [place], [place] [address] Phone#: [phone]     [facility]): (offers outpatient therapy and intensive outpatient substance abuse therapy).  [address], [place], [place] [address] Phone: [phone]   [facility]: Offers FREE recovery skills classes, support groups, 1:1 Peer Support, and Compeer Classes. [address], [place], [place] [address] Phone: [phone] (Call to complete intake).  [facility] [address] [place], [place] [address] Phone: [phone] ext: [phone] [facility] provides food, shelter and other programs and services to the homeless men of [place] through our men's program.   By offering safe shelter, three meals a day,  clean clothing, Biblical counseling, financial planning, vocational training, GED/education and employment assistance, we've helped mend the shattered lives of many homeless men since opening in 1974.   We have approximately 267 beds available, with a max of 312 beds including mats for emergency situations and currently house an average of 270 men a night.   Prospective Client Check-In Information Photo ID Required (State/ Out of State/ DOC) - if photo ID is not available, clients are required to have a printout of a police/sheriff's criminal history report. Help out with chores around the Mission. No sex offender of any type (pending, charged, registered and/or any other sex related offenses) will be permitted to check in. Must be willing to abide by all rules, regulations, and policies established by the [facility]. The following will be provided - shelter, food, clothing, and biblical counseling. If you or someone you know is in need of assistance at our men's shelter in , [place], please call [phone] ext. [phone].   [facility]-will provide timely access to mental health services for children and adolescents (4-17) and adults presenting in a mental health crisis. The program is designed for those who need urgent Behavioral Health or Substance Use treatment and are not experiencing a medical crisis that would typically require an emergency room visit.    [address] [place],  [address] Phone: [phone] [email or web address]   [facility]: Phone#: [phone]   The [facility] SA Intensive Outpatient Program (SAIOP) means structured individual and group addiction activities and services that are provided at an outpatient program designed to assist adult and adolescent consumers to begin recovery and learn skills for recovery maintenance. The [facility] SAIOP program is offered at   least 3 hours a  day, 3 days a week.SAIOP services shall include a structured program consisting of, but not limited to, the following services: Individual counseling and support; Group counseling and support; Family counseling, training or support; Biochemical assays to identify recent drug use (e.g., urine drug screens); Strategies for relapse prevention to include community and social support systems in treatment; Life skills; Crisis contingency planning; Disease Management; and Treatment support activities that have been adapted or specifically designed for persons with physical disabilities, or persons with co-occurring disorders of mental illness and substance abuse/dependence or mental retardation/developmental disability and substance abuse/dependence. Phone: [phone]     [facility] 24-Hour Call Center: 1-800-256-2452  Behavioral Health Crisis Line: [phone]       

## 2021-10-30 NOTE — ED Notes (Signed)
Pt admitted to [facility] pending negative COVID results. Currently in holding on observation unit. Passive SI, cocaine abuse. Pt reports last use of cocaine last night. Pt presents diaphoretic, restless and cold chills. Irritable. Upon entering unit, pt received Ativan, Gabapentin and Clonidine, then demanded to leave. Pt states, "I got the medicine, now I want to leave". NP notified and entered unit. Pt called mother to pick him up. Provider attempted to encourage pt stay for observation but pt refused. NP informed pt and mother that pt would have to stay 48min-1 hr to monitor for SE from medications administered. Pt reluctantly, complied with provider request. Safety maintained.

## 2021-10-31 LAB — HEMOGLOBIN A1C
Hgb A1c MFr Bld: 5.3 % (ref 4.8–5.6)
Mean Plasma Glucose: 105 mg/dL

## 2021-10-31 LAB — RPR: RPR Ser Ql: NONREACTIVE

## 2021-11-04 ENCOUNTER — Telehealth (HOSPITAL_COMMUNITY): Payer: Self-pay

## 2021-11-04 NOTE — BH Assessment (Signed)
Care Management - Follow Up Discharges   Writer attempted to make contact with patient today and was unsuccessful.  Phone rang and no voicemail  Per chart review, patient was provided with outpatient resources.

## 2022-01-05 ENCOUNTER — Other Ambulatory Visit: Payer: Self-pay

## 2022-01-05 ENCOUNTER — Encounter (HOSPITAL_COMMUNITY): Payer: Self-pay | Admitting: Emergency Medicine

## 2022-01-05 ENCOUNTER — Ambulatory Visit (HOSPITAL_COMMUNITY)
Admission: EM | Admit: 2022-01-05 | Discharge: 2022-01-05 | Disposition: A | Discharge status: Home / Self Care | Payer: Self-pay | Attending: Family Medicine | Admitting: Family Medicine

## 2022-01-05 DIAGNOSIS — L0201 Cutaneous abscess of face: Secondary | ICD-10-CM

## 2022-01-05 MED ORDER — DOXYCYCLINE HYCLATE 100 MG PO CAPS
100.0000 mg | ORAL_CAPSULE | Freq: Two times a day (BID) | ORAL | 0 refills | Status: AC
Start: 2022-01-05 — End: 2022-01-12

## 2022-01-05 NOTE — ED Triage Notes (Signed)
Reports has recurring abscess on right jaw. Reports popped it earlier today.

## 2022-01-05 NOTE — Discharge Instructions (Signed)
Warm compresses Antibiotics as prescribed  
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
## 2022-01-14 ENCOUNTER — Ambulatory Visit (HOSPITAL_COMMUNITY): Payer: Self-pay

## 2022-01-24 ENCOUNTER — Ambulatory Visit (HOSPITAL_COMMUNITY): Payer: Self-pay

## 2022-01-26 ENCOUNTER — Ambulatory Visit (INDEPENDENT_AMBULATORY_CARE_PROVIDER_SITE_OTHER): Payer: Self-pay

## 2022-01-26 ENCOUNTER — Other Ambulatory Visit: Payer: Self-pay

## 2022-01-26 ENCOUNTER — Encounter (HOSPITAL_COMMUNITY): Payer: Self-pay

## 2022-01-26 ENCOUNTER — Ambulatory Visit (HOSPITAL_COMMUNITY)
Admission: RE | Admit: 2022-01-26 | Discharge: 2022-01-26 | Disposition: A | Discharge status: Home / Self Care | Payer: Self-pay | Source: Ambulatory Visit | Attending: Physician Assistant | Admitting: Physician Assistant

## 2022-01-26 VITALS — BP 149/101 | HR 69 | Temp 99.1°F | Resp 20

## 2022-01-26 DIAGNOSIS — M79645 Pain in left finger(s): Secondary | ICD-10-CM

## 2022-01-26 DIAGNOSIS — S6992XA Unspecified injury of left wrist, hand and finger(s), initial encounter: Secondary | ICD-10-CM

## 2022-01-26 NOTE — ED Triage Notes (Signed)
Patient injured left thumb 2 days ago while skateboarding.  Patient fell in a position that left thumb was jammed.  Reports left thumb was bent backwards and he pulled it back in place.  Left thumb is very swollen
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
## 2022-01-26 NOTE — Discharge Instructions (Signed)
Stay in the splint for the next several weeks.  You can remove this for showering.  Use ibuprofen 800 mg that you have at home up to 3 times a day as needed for pain.  Use Tylenol for additional pain relief.  Use ice and elevation.  If at any point the pain becomes more extreme, you have difficulty moving the thumb, you get numbness or tingling you need to go to the emergency room or be seen immediately.  Follow-up with orthopedics if symptoms or not improving quickly; call to schedule an appointment.

## 2022-07-07 ENCOUNTER — Ambulatory Visit (HOSPITAL_COMMUNITY)
Admission: RE | Admit: 2022-07-07 | Discharge: 2022-07-07 | Disposition: A | Discharge status: Home / Self Care | Payer: Commercial Managed Care - HMO | Source: Ambulatory Visit | Attending: Physician Assistant | Admitting: Physician Assistant

## 2022-07-07 ENCOUNTER — Encounter (HOSPITAL_COMMUNITY): Payer: Self-pay

## 2022-07-07 ENCOUNTER — Other Ambulatory Visit: Payer: Self-pay

## 2022-07-07 VITALS — BP 138/89 | HR 63 | Temp 99.2°F | Resp 18

## 2022-07-07 DIAGNOSIS — S46911A Strain of unspecified muscle, fascia and tendon at shoulder and upper arm level, right arm, initial encounter: Secondary | ICD-10-CM | POA: Diagnosis not present

## 2022-07-07 DIAGNOSIS — M79605 Pain in left leg: Secondary | ICD-10-CM | POA: Diagnosis not present

## 2022-07-07 MED ORDER — HYDROCODONE-ACETAMINOPHEN 5-325 MG PO TABS: 1.0000 | ORAL_TABLET | Freq: Four times a day (QID) | ORAL | 0 refills | Status: DC | PRN

## 2022-07-07 MED ORDER — PREDNISONE 20 MG PO TABS
40.0000 mg | ORAL_TABLET | Freq: Every day | ORAL | 0 refills | Status: DC
Start: 2022-07-07 — End: 2023-02-12

## 2022-07-07 NOTE — ED Triage Notes (Signed)
Pt reports on Thursday he picked up 6 logs and one fell on his Lt leg and his Rt shoulder hurts. Pt reports he can barely pick up his RT arm due to shoulder pain.
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
## 2022-07-07 NOTE — Discharge Instructions (Addendum)
Follow up with PCP and ortho  Apply ice heat, and stretching to shoulder

## 2022-08-09 ENCOUNTER — Encounter (HOSPITAL_COMMUNITY): Payer: Self-pay | Admitting: Emergency Medicine

## 2022-08-09 ENCOUNTER — Emergency Department (HOSPITAL_COMMUNITY): Payer: Commercial Managed Care - HMO

## 2022-08-09 ENCOUNTER — Other Ambulatory Visit: Payer: Self-pay

## 2022-08-09 ENCOUNTER — Emergency Department (HOSPITAL_COMMUNITY)
Admission: EM | Admit: 2022-08-09 | Discharge: 2022-08-09 | Discharge status: Against Medical Advice | Payer: Commercial Managed Care - HMO | Attending: Emergency Medicine | Admitting: Emergency Medicine

## 2022-08-09 DIAGNOSIS — K920 Hematemesis: Secondary | ICD-10-CM | POA: Diagnosis not present

## 2022-08-09 DIAGNOSIS — R0602 Shortness of breath: Secondary | ICD-10-CM | POA: Diagnosis not present

## 2022-08-09 DIAGNOSIS — R109 Unspecified abdominal pain: Secondary | ICD-10-CM | POA: Diagnosis not present

## 2022-08-09 DIAGNOSIS — Z5321 Procedure and treatment not carried out due to patient leaving prior to being seen by health care provider: Secondary | ICD-10-CM | POA: Insufficient documentation

## 2022-08-09 LAB — CBC
HCT: 44.9 % (ref 39.0–52.0)
Hemoglobin: 15.8 g/dL (ref 13.0–17.0)
MCH: 32.2 pg (ref 26.0–34.0)
MCHC: 35.2 g/dL (ref 30.0–36.0)
MCV: 91.6 fL (ref 80.0–100.0)
Platelets: 311 10*3/uL (ref 150–400)
RBC: 4.9 MIL/uL (ref 4.22–5.81)
RDW: 11.9 % (ref 11.5–15.5)
WBC: 10.6 10*3/uL — ABNORMAL HIGH (ref 4.0–10.5)
nRBC: 0 % (ref 0.0–0.2)

## 2022-08-09 LAB — URINALYSIS, ROUTINE W REFLEX MICROSCOPIC
Bacteria, UA: NONE SEEN
Bilirubin Urine: NEGATIVE
Glucose, UA: NEGATIVE mg/dL
Hgb urine dipstick: NEGATIVE
Ketones, ur: NEGATIVE mg/dL
Nitrite: NEGATIVE
Protein, ur: NEGATIVE mg/dL
Specific Gravity, Urine: 1.017 (ref 1.005–1.030)
pH: 5 (ref 5.0–8.0)

## 2022-08-09 LAB — COMPREHENSIVE METABOLIC PANEL
ALT: 21 U/L (ref 0–44)
AST: 30 U/L (ref 15–41)
Albumin: 3.6 g/dL (ref 3.5–5.0)
Alkaline Phosphatase: 71 U/L (ref 38–126)
Anion gap: 8 (ref 5–15)
BUN: 15 mg/dL (ref 6–20)
CO2: 26 mmol/L (ref 22–32)
Calcium: 9 mg/dL (ref 8.9–10.3)
Chloride: 104 mmol/L (ref 98–111)
Creatinine, Ser: 1.02 mg/dL (ref 0.61–1.24)
GFR, Estimated: >60 mL/min (ref >60)
Glucose, Bld: 97 mg/dL (ref 70–99)
Potassium: 4 mmol/L (ref 3.5–5.1)
Sodium: 138 mmol/L (ref 135–145)
Total Bilirubin: 0.5 mg/dL (ref 0.3–1.2)
Total Protein: 6 g/dL — ABNORMAL LOW (ref 6.5–8.1)

## 2022-08-09 LAB — TYPE AND SCREEN
ABO/RH(D): O POS
Antibody Screen: NEGATIVE

## 2022-08-09 LAB — LIPASE, BLOOD: Lipase: 38 U/L (ref 11–51)

## 2022-08-09 MED ORDER — OCTREOTIDE LOAD VIA INFUSION
100.0000 ug | Freq: Once | INTRAVENOUS | Status: DC
  Filled 2022-08-09: qty 50

## 2022-08-09 MED ORDER — PANTOPRAZOLE SODIUM 20 MG PO TBEC: 20.0000 mg | DELAYED_RELEASE_TABLET | Freq: Every day | ORAL | 0 refills | Status: DC

## 2022-08-09 MED ORDER — HYDROMORPHONE HCL 1 MG/ML IJ SOLN
1.0000 mg | Freq: Once | INTRAMUSCULAR | Status: AC
  Administered 2022-08-09: 1 mg via INTRAVENOUS
  Filled 2022-08-09: qty 1

## 2022-08-09 MED ORDER — SODIUM CHLORIDE 0.9 % IV SOLN
50.0000 ug/h | INTRAVENOUS | Status: DC
  Filled 2022-08-09: qty 1

## 2022-08-09 MED ORDER — PANTOPRAZOLE SODIUM 40 MG IV SOLR
40.0000 mg | Freq: Once | INTRAVENOUS | Status: AC
  Administered 2022-08-09: 40 mg via INTRAVENOUS
  Filled 2022-08-09: qty 10

## 2022-08-09 MED ORDER — METOCLOPRAMIDE HCL 10 MG PO TABS: 10.0000 mg | ORAL_TABLET | Freq: Three times a day (TID) | ORAL | 0 refills | Status: DC | PRN

## 2022-08-09 MED ORDER — LACTATED RINGERS IV BOLUS
1000.0000 mL | Freq: Once | INTRAVENOUS | Status: AC
Start: 2022-08-09 — End: 2022-08-09
  Administered 2022-08-09: 1000 mL via INTRAVENOUS

## 2022-08-09 MED ORDER — METOCLOPRAMIDE HCL 5 MG/ML IJ SOLN
10.0000 mg | Freq: Once | INTRAMUSCULAR | Status: AC
  Administered 2022-08-09: 10 mg via INTRAVENOUS
  Filled 2022-08-09: qty 2

## 2022-08-09 NOTE — ED Triage Notes (Signed)
Patient reports multiple hematemesis this morning with mild SOB and pain across his abdomen.

## 2022-09-21 ENCOUNTER — Emergency Department (HOSPITAL_COMMUNITY)
Admission: EM | Admit: 2022-09-21 | Discharge: 2022-09-21 | Disposition: A | Discharge status: Home / Self Care | Payer: Commercial Managed Care - HMO | Attending: Emergency Medicine | Admitting: Emergency Medicine

## 2022-09-21 ENCOUNTER — Other Ambulatory Visit: Payer: Self-pay

## 2022-09-21 ENCOUNTER — Encounter (HOSPITAL_COMMUNITY): Payer: Self-pay | Admitting: Emergency Medicine

## 2022-09-21 DIAGNOSIS — Y92007 Garden or yard of unspecified non-institutional (private) residence as the place of occurrence of the external cause: Secondary | ICD-10-CM | POA: Diagnosis not present

## 2022-09-21 DIAGNOSIS — I10 Essential (primary) hypertension: Secondary | ICD-10-CM | POA: Diagnosis not present

## 2022-09-21 DIAGNOSIS — L03114 Cellulitis of left upper limb: Secondary | ICD-10-CM

## 2022-09-21 DIAGNOSIS — T63001A Toxic effect of unspecified snake venom, accidental (unintentional), initial encounter: Secondary | ICD-10-CM | POA: Diagnosis present

## 2022-09-21 DIAGNOSIS — Z79899 Other long term (current) drug therapy: Secondary | ICD-10-CM | POA: Diagnosis not present

## 2022-09-21 DIAGNOSIS — Z23 Encounter for immunization: Secondary | ICD-10-CM | POA: Diagnosis not present

## 2022-09-21 DIAGNOSIS — W5911XA Bitten by nonvenomous snake, initial encounter: Secondary | ICD-10-CM

## 2022-09-21 DIAGNOSIS — Y93H2 Activity, gardening and landscaping: Secondary | ICD-10-CM | POA: Diagnosis not present

## 2022-09-21 LAB — BASIC METABOLIC PANEL
Anion gap: 13 (ref 5–15)
BUN: 10 mg/dL (ref 6–20)
CO2: 23 mmol/L (ref 22–32)
Calcium: 9.3 mg/dL (ref 8.9–10.3)
Chloride: 104 mmol/L (ref 98–111)
Creatinine, Ser: 1.03 mg/dL (ref 0.61–1.24)
GFR, Estimated: >60 mL/min (ref >60)
Glucose, Bld: 85 mg/dL (ref 70–99)
Potassium: 3.9 mmol/L (ref 3.5–5.1)
Sodium: 140 mmol/L (ref 135–145)

## 2022-09-21 LAB — CBC WITH DIFFERENTIAL/PLATELET
Abs Immature Granulocytes: 0.04 10*3/uL (ref 0.00–0.07)
Basophils Absolute: 0 10*3/uL (ref 0.0–0.1)
Basophils Relative: 0 %
Eosinophils Absolute: 0.2 10*3/uL (ref 0.0–0.5)
Eosinophils Relative: 2 %
HCT: 42.3 % (ref 39.0–52.0)
Hemoglobin: 14.4 g/dL (ref 13.0–17.0)
Immature Granulocytes: 0 %
Lymphocytes Relative: 22 %
Lymphs Abs: 2 10*3/uL (ref 0.7–4.0)
MCH: 31.6 pg (ref 26.0–34.0)
MCHC: 34 g/dL (ref 30.0–36.0)
MCV: 93 fL (ref 80.0–100.0)
Monocytes Absolute: 0.5 10*3/uL (ref 0.1–1.0)
Monocytes Relative: 5 %
Neutro Abs: 6.6 10*3/uL (ref 1.7–7.7)
Neutrophils Relative %: 71 %
Platelets: 296 10*3/uL (ref 150–400)
RBC: 4.55 MIL/uL (ref 4.22–5.81)
RDW: 11.9 % (ref 11.5–15.5)
WBC: 9.4 10*3/uL (ref 4.0–10.5)
nRBC: 0 % (ref 0.0–0.2)

## 2022-09-21 LAB — FIBRINOGEN: Fibrinogen: 382 mg/dL (ref 210–475)

## 2022-09-21 LAB — PROTIME-INR
INR: 1 (ref 0.8–1.2)
Prothrombin Time: 13 seconds (ref 11.4–15.2)

## 2022-09-21 MED ORDER — TETANUS-DIPHTH-ACELL PERTUSSIS 5-2.5-18.5 LF-MCG/0.5 IM SUSY
0.5000 mL | PREFILLED_SYRINGE | Freq: Once | INTRAMUSCULAR | Status: AC
  Administered 2022-09-21: 0.5 mL via INTRAMUSCULAR
  Filled 2022-09-21: qty 0.5

## 2022-09-21 MED ORDER — METRONIDAZOLE 500 MG PO TABS: 500.0000 mg | ORAL_TABLET | Freq: Two times a day (BID) | ORAL | 0 refills | Status: DC

## 2022-09-21 MED ORDER — TETANUS-DIPHTHERIA TOXOIDS TD 2-2 LF/0.5ML IM SUSP: 0.5000 mL | Freq: Once | INTRAMUSCULAR | 0 refills | Status: DC

## 2022-09-21 MED ORDER — OXYCODONE-ACETAMINOPHEN 5-325 MG PO TABS
2.0000 | ORAL_TABLET | Freq: Once | ORAL | Status: AC
  Administered 2022-09-21: 2 via ORAL
  Filled 2022-09-21: qty 2

## 2022-09-21 MED ORDER — METRONIDAZOLE 500 MG PO TABS
500.0000 mg | ORAL_TABLET | Freq: Once | ORAL | Status: AC
  Administered 2022-09-21: 500 mg via ORAL
  Filled 2022-09-21: qty 1

## 2022-09-21 MED ORDER — LIDOCAINE HCL (PF) 1 % IJ SOLN
INTRAMUSCULAR | Status: AC
  Filled 2022-09-21: qty 5

## 2022-09-21 MED ORDER — IBUPROFEN 800 MG PO TABS
800.0000 mg | ORAL_TABLET | Freq: Once | ORAL | Status: AC
  Administered 2022-09-21: 800 mg via ORAL
  Filled 2022-09-21: qty 1

## 2022-09-21 MED ORDER — CEFDINIR 300 MG PO CAPS: 300.0000 mg | ORAL_CAPSULE | Freq: Two times a day (BID) | ORAL | 0 refills | Status: DC

## 2022-09-21 MED ORDER — OXYCODONE-ACETAMINOPHEN 5-325 MG PO TABS: 1.0000 | ORAL_TABLET | Freq: Four times a day (QID) | ORAL | 0 refills | Status: DC | PRN

## 2022-09-21 MED ORDER — TETANUS-DIPHTH-ACELL PERTUSSIS 5-2.5-18.5 LF-MCG/0.5 IM SUSY
0.5000 mL | PREFILLED_SYRINGE | Freq: Once | INTRAMUSCULAR | Status: DC
  Filled 2022-09-21: qty 0.5

## 2022-09-21 MED ORDER — CEFTRIAXONE SODIUM 1 G IJ SOLR
1.0000 g | Freq: Once | INTRAMUSCULAR | Status: AC
  Administered 2022-09-21: 1 g via INTRAMUSCULAR
  Filled 2022-09-21: qty 10

## 2022-09-21 MED ORDER — IBUPROFEN 800 MG PO TABS: 800.0000 mg | ORAL_TABLET | Freq: Three times a day (TID) | ORAL | 0 refills | Status: DC

## 2022-09-21 NOTE — ED Provider Notes (Signed)
[facility] EMERGENCY DEPARTMENT Provider Note   CSN: [id number] Arrival date & time: 09/21/22  0416     History  Chief Complaint  Patient presents with   Snake Bite    Left Arm Swelling    Raymond Ford is a 46 y.o. male.  HPI Patient was working outside in [employer] and [employer]  Yesterday at 1 PM he encountered several snakes.  He got a snakebite to the left wrist and medial aspect of the left hand.  He reports one of the snakes had a tawny brown color and seem to have some dark spots on his back and the other was a cream-colored snake.  He reports it was very painful.  He washed the wound and has been applying antibiotic ointment to it.  He reports over the course of the next several hours it got pretty swollen in the hand and the wrist.  He reports it continues to be painful and he is got burning around the hand and the wrist and some pain up into the forearm.  No fevers, no chills, patient reports he has a generalized headache.  No vomiting.  No syncope.  Patient denies any medical problems except hypertension.  Reports he is compliant with his antihypertensive medications and blood pressures are typically well controlled.    Home Medications Prior to Admission medications   Medication Sig Start Date End Date Taking? Authorizing Provider  cefdinir (OMNICEF) 300 MG capsule Take 1 capsule (300 mg total) by mouth 2 (two) times daily. 09/21/22  Yes Arby Barrette, MD  ibuprofen (ADVIL) 800 MG tablet Take 1 tablet (800 mg total) by mouth 3 (three) times daily. 09/21/22  Yes Arby Barrette, MD  metroNIDAZOLE (FLAGYL) 500 MG tablet Take 1 tablet (500 mg total) by mouth 2 (two) times daily. One po bid x 7 days 09/21/22  Yes Ramere Downs, Lebron Conners, MD  oxyCODONE-acetaminophen (PERCOCET) 5-325 MG tablet Take 1-2 tablets by mouth every 6 (six) hours as needed. 09/21/22  Yes Kaniel Kiang, Lebron Conners, MD  amLODipine (NORVASC) 5 MG tablet Take 1 tablet (5 mg total) by  mouth daily. 05/25/20   Gwyneth Sprout, MD  HYDROcodone-acetaminophen (NORCO/VICODIN) 5-325 MG tablet Take 1 tablet by mouth every 6 (six) hours as needed. 07/07/22   Evern Core, PA-C  metoCLOPramide (REGLAN) 10 MG tablet Take 1 tablet (10 mg total) by mouth every 8 (eight) hours as needed for nausea. 08/09/22   Mesner, Barbara Cower, MD  ondansetron (ZOFRAN-ODT) 8 MG disintegrating tablet Take 1 tablet (8 mg total) by mouth every 8 (eight) hours as needed for nausea or vomiting. Patient not taking: No sig reported 05/21/21   Wallis Bamberg, PA-C  pantoprazole (PROTONIX) 20 MG tablet Take 1 tablet (20 mg total) by mouth daily for 14 days. 08/09/22 08/23/22  Mesner, Barbara Cower, MD  predniSONE (DELTASONE) 20 MG tablet Take 2 tablets (40 mg total) by mouth daily with breakfast. 07/07/22   Evern Core, PA-C  famotidine (PEPCID) 20 MG tablet Take 1 tablet (20 mg total) by mouth 2 (two) times daily. Patient not taking: Reported on 03/11/2020 02/19/20 03/11/20  Lorre Nick, MD  hydrochlorothiazide (HYDRODIURIL) 25 MG tablet Take 1 tablet (25 mg total) by mouth daily. 04/19/19 01/24/20  Grayce Sessions, NP  omeprazole (PRILOSEC) 20 MG capsule Take 1 capsule (20 mg total) by mouth 2 (two) times daily before a meal. 12/19/19 01/24/20  Wieters, Hallie C, PA-C  sucralfate (CARAFATE) 1 g tablet Take 1 tablet (1 g total) by mouth 4 (  four) times daily. Patient not taking: Reported on 03/11/2020 02/19/20 03/11/20  Lacretia Leigh, MD      Allergies    Bee venom    Review of Systems   Review of Systems  Physical Exam Updated Vital Signs BP 120/71   Pulse 65   Temp 99 F (37.2 C) (Oral)   Resp 16   SpO2 96%  Physical Exam Constitutional:      Comments: Alert nontoxic well in appearance.  Nourished well-developed.  HENT:     Mouth/Throat:     Pharynx: Oropharynx is clear.  Eyes:     Extraocular Movements: Extraocular movements intact.  Cardiovascular:     Rate and Rhythm: Normal rate.  Pulmonary:     Effort:  Pulmonary effort is normal.     Breath sounds: Normal breath sounds.  Abdominal:     General: There is no distension.     Palpations: Abdomen is soft.  Musculoskeletal:     Comments: Patient has moderate swelling of the dorsum of the left hand.  Range of motion of fingers is intact but uncomfortable for the patient.  There is no objective swelling of the fingers.  There is a bite wound on the wrist with 2 puncture sites.  Wrist is mildly swollen.  Painful with movement.  Forearm possible mild swelling but not firm or edematous.  Objectively I do not appreciate red streaking on the forearm.  But is uncomfortable to palpation for the patient.  See attached images.  Skin:    General: Skin is warm and dry.  Neurological:     General: No focal deficit present.     Mental Status: He is oriented to person, place, and time.     Motor: No weakness.     Coordination: Coordination normal.  Psychiatric:        Mood and Affect: Mood normal.            ED Results / Procedures / Treatments   Labs (all labs ordered are listed, but only abnormal results are displayed) Labs Reviewed  BASIC METABOLIC PANEL  CBC WITH DIFFERENTIAL/PLATELET  PROTIME-INR  FIBRINOGEN  CBC WITH DIFFERENTIAL/PLATELET  CBC WITH DIFFERENTIAL/PLATELET  PROTIME-INR  PROTIME-INR  FIBRINOGEN  FIBRINOGEN    EKG None  Radiology No results found.  Procedures Procedures    Medications Ordered in ED Medications  cefTRIAXone (ROCEPHIN) injection 1 g (has no administration in time range)  metroNIDAZOLE (FLAGYL) tablet 500 mg (has no administration in time range)  ibuprofen (ADVIL) tablet 800 mg (has no administration in time range)  oxyCODONE-acetaminophen (PERCOCET/ROXICET) 5-325 MG per tablet 2 tablet (has no administration in time range)  Tdap (BOOSTRIX) injection 0.5 mL (has no administration in time range)  Tdap (BOOSTRIX) injection 0.5 mL (0.5 mLs Intramuscular Given 09/21/22 0429)  oxyCODONE-acetaminophen  (PERCOCET/ROXICET) 5-325 MG per tablet 2 tablet (2 tablets Oral Given 09/21/22 0428)    ED Course/ Medical Decision Making/ A&P                           Medical Decision Making  Patient had a snakebite approximately 19 hours ago.  By description possibly a copperhead.  At this point, patient has limited swelling to the hand and wrist without significant objective swelling to the forearm.  Patient does not have criteria for antivenom.  He has a headache however he has been awake almost all night and the emergency department waiting room.  His mental status is very clear.  No neurologic deficits.  No tremors.  No sweats.  No signs of systemic reaction.  There is objective swelling of the hand and wrist that is mild.  Patient has significant pain with palpation in the areas of inflammation and endorses pain with range of motion of digits and wrist.  The wound has mild amount of drainage.  At this time with significant pain and some wound drainage and redness, I will opt to empirically treat for infection and suspected cellulitis.  Patient is given an IM dose of Rocephin and oral dose of Flagyl in the emergency department.  He is also given Percocet and ibuprofen orally for pain control.  Plan will be for discharge with Omnicef and Flagyl.  I have counseled the patient extensively on close observation and return for reassessment for signs or symptoms of any significantly worsening cellulitis or tenosynovitis.  One bite is directly over the wrist and may have potential for joint infection.  Patient voices understanding.  Patient has history of hypertension.  He reports he is compliant with his medications.  Blood pressures are normotensive.  Patient reinforced for good management of hypertension.        Final Clinical Impression(s) / ED Diagnoses Final diagnoses:  Snake bite, initial encounter  Cellulitis of left upper extremity  Hypertension, well controlled    Rx / DC Orders ED Discharge  Orders          Ordered    diptheria-tetanus toxoids [facility]) 2-2 LF/0.5ML injection   Once,   Status:  Discontinued        09/21/22 0809    oxyCODONE-acetaminophen (PERCOCET) 5-325 MG tablet  Every 6 hours PRN        09/21/22 0822    cefdinir (OMNICEF) 300 MG capsule  2 times daily        09/21/22 0822    metroNIDAZOLE (FLAGYL) 500 MG tablet  2 times daily        09/21/22 0822    ibuprofen (ADVIL) 800 MG tablet  3 times daily        09/21/22 5638              Arby Barrette, MD 09/21/22 [id number]

## 2022-09-21 NOTE — ED Provider Triage Note (Signed)
Emergency Medicine Provider Triage Evaluation Note  Raymond Ford , a 46 y.o. male  was evaluated in triage.  Pt complains of snake bite to left forearm that occurred yesterday afternoon around 4PM while doing yard work.  Thinks it may have been a copperhead.  Unsure of last tetanus.  No fever/chills.  No meds PTA.  Review of Systems  Positive: Snake bite Negative: fever  Physical Exam  There were no vitals taken for this visit.  Gen:   Awake, no distress   Resp:  Normal effort  MSK:   Moves extremities without difficulty  Other:  Puncture wound noted to left volar wrist, there is small amount of drainage, swelling extending up to mid-forearm and down into the hand, compartments soft and compressible, radial pulse intact     Medical Decision Making  Medically screening exam initiated at 4:18 AM.  Appropriate orders placed.  Lanard R Weninger was informed that the remainder of the evaluation will be completed by another provider, this initial triage assessment does not replace that evaluation, and the importance of remaining in the ED until their evaluation is complete.  Snake bite.  Does have fairly extensive swelling but compartments are soft and arm is NVI.  Will update tetanus, check labs as per snake bite protocol.   Larene Pickett, PA-C 09/21/22 [phone]

## 2022-09-21 NOTE — Discharge Instructions (Signed)
1.  At this point, the amount of swelling you have would not be treated with antivenom (the treatment for severe swelling from a snakebite).  However, due to swelling and pain, I have concern for infection.  You need to start the antibiotics as prescribed and complete the course of antibiotics. 2.  For your baseline pain, take ibuprofen 800 mg every 8 hours with food.  This medication will be the most helpful with swelling and inflammation that is causing pain.  If you have additional pain that is not adequately controlled by ibuprofen, you may take 1-2 Percocet tablets every 6 hours.  As soon as pain is adequately controlled, you may change from Percocet to extra strength Tylenol every 6 hours.  Try to minimize the amount of Percocet you take.  Percocet is addictive and can cause constipation. 3.  Elevate your arm above the level of your heart is much as possible for the next several days.  Continue to clean the wound with mild soap and water.  Apply bacitracin ointment 2-3 times a day as well. 4.  Return to the emergency department for recheck if you are getting increasingly painful, red swelling at the wrist joint, fingers or hand.  Return if you are getting red streaking and swelling of your forearm.  If you have a family doctor, follow-up with a family doctor in 2 to 3 days.  If you do not have a family doctor, you may use the referral number in your discharge instructions to find one.

## 2022-09-21 NOTE — ED Triage Notes (Signed)
Patient arrived with EMS from home reports snake bite last Friday afternoon at left hand with increasing pain /swelling at left hand and left forearm .

## 2022-10-24 ENCOUNTER — Encounter (HOSPITAL_COMMUNITY): Payer: Self-pay

## 2022-10-24 ENCOUNTER — Emergency Department (HOSPITAL_COMMUNITY)
Admission: EM | Admit: 2022-10-24 | Discharge: 2022-10-24 | Disposition: A | Discharge status: Home / Self Care | Payer: Commercial Managed Care - HMO | Attending: Emergency Medicine | Admitting: Emergency Medicine

## 2022-10-24 ENCOUNTER — Other Ambulatory Visit: Payer: Self-pay

## 2022-10-24 DIAGNOSIS — D72829 Elevated white blood cell count, unspecified: Secondary | ICD-10-CM | POA: Diagnosis not present

## 2022-10-24 DIAGNOSIS — I10 Essential (primary) hypertension: Secondary | ICD-10-CM | POA: Diagnosis not present

## 2022-10-24 DIAGNOSIS — R112 Nausea with vomiting, unspecified: Secondary | ICD-10-CM | POA: Diagnosis present

## 2022-10-24 DIAGNOSIS — M79652 Pain in left thigh: Secondary | ICD-10-CM | POA: Insufficient documentation

## 2022-10-24 DIAGNOSIS — Z79899 Other long term (current) drug therapy: Secondary | ICD-10-CM | POA: Insufficient documentation

## 2022-10-24 LAB — CBC WITH DIFFERENTIAL/PLATELET
Abs Immature Granulocytes: 0.03 10*3/uL (ref 0.00–0.07)
Basophils Absolute: 0 10*3/uL (ref 0.0–0.1)
Basophils Relative: 0 %
Eosinophils Absolute: 0.1 10*3/uL (ref 0.0–0.5)
Eosinophils Relative: 1 %
HCT: 48.1 % (ref 39.0–52.0)
Hemoglobin: 16.7 g/dL (ref 13.0–17.0)
Immature Granulocytes: 0 %
Lymphocytes Relative: 15 %
Lymphs Abs: 1.7 10*3/uL (ref 0.7–4.0)
MCH: 31.6 pg (ref 26.0–34.0)
MCHC: 34.7 g/dL (ref 30.0–36.0)
MCV: 90.9 fL (ref 80.0–100.0)
Monocytes Absolute: 0.4 10*3/uL (ref 0.1–1.0)
Monocytes Relative: 4 %
Neutro Abs: 9.1 10*3/uL — ABNORMAL HIGH (ref 1.7–7.7)
Neutrophils Relative %: 80 %
Platelets: 295 10*3/uL (ref 150–400)
RBC: 5.29 MIL/uL (ref 4.22–5.81)
RDW: 11.9 % (ref 11.5–15.5)
WBC: 11.3 10*3/uL — ABNORMAL HIGH (ref 4.0–10.5)
nRBC: 0 % (ref 0.0–0.2)

## 2022-10-24 LAB — COMPREHENSIVE METABOLIC PANEL
ALT: 24 U/L (ref 0–44)
AST: 25 U/L (ref 15–41)
Albumin: 3.9 g/dL (ref 3.5–5.0)
Alkaline Phosphatase: 72 U/L (ref 38–126)
Anion gap: 8 (ref 5–15)
BUN: 8 mg/dL (ref 6–20)
CO2: 25 mmol/L (ref 22–32)
Calcium: 8.4 mg/dL — ABNORMAL LOW (ref 8.9–10.3)
Chloride: 106 mmol/L (ref 98–111)
Creatinine, Ser: 1.05 mg/dL (ref 0.61–1.24)
GFR, Estimated: >60 mL/min (ref >60)
Glucose, Bld: 99 mg/dL (ref 70–99)
Potassium: 3.9 mmol/L (ref 3.5–5.1)
Sodium: 139 mmol/L (ref 135–145)
Total Bilirubin: 0.6 mg/dL (ref 0.3–1.2)
Total Protein: 6.9 g/dL (ref 6.5–8.1)

## 2022-10-24 LAB — URINALYSIS, ROUTINE W REFLEX MICROSCOPIC
Bilirubin Urine: NEGATIVE
Glucose, UA: NEGATIVE mg/dL
Hgb urine dipstick: NEGATIVE
Ketones, ur: 5 mg/dL — AB
Leukocytes,Ua: NEGATIVE
Nitrite: NEGATIVE
Protein, ur: NEGATIVE mg/dL
Specific Gravity, Urine: 1.012 (ref 1.005–1.030)
pH: 7 (ref 5.0–8.0)

## 2022-10-24 LAB — LIPASE, BLOOD: Lipase: 28 U/L (ref 11–51)

## 2022-10-24 MED ORDER — SODIUM CHLORIDE 0.9 % IV BOLUS
1000.0000 mL | Freq: Once | INTRAVENOUS | Status: AC
  Administered 2022-10-24: 1000 mL via INTRAVENOUS

## 2022-10-24 MED ORDER — ONDANSETRON HCL 4 MG PO TABS: 4.0000 mg | ORAL_TABLET | Freq: Four times a day (QID) | ORAL | 0 refills | Status: DC | PRN

## 2022-10-24 MED ORDER — ONDANSETRON HCL 4 MG/2ML IJ SOLN
4.0000 mg | Freq: Once | INTRAMUSCULAR | Status: AC
  Administered 2022-10-24: 4 mg via INTRAVENOUS
  Filled 2022-10-24: qty 2

## 2022-10-24 MED ORDER — DIPHENHYDRAMINE HCL 50 MG/ML IJ SOLN
12.5000 mg | Freq: Once | INTRAMUSCULAR | Status: AC
  Administered 2022-10-24: 12.5 mg via INTRAVENOUS
  Filled 2022-10-24: qty 1

## 2022-10-24 MED ORDER — FENTANYL CITRATE PF 50 MCG/ML IJ SOSY
50.0000 ug | PREFILLED_SYRINGE | Freq: Once | INTRAMUSCULAR | Status: AC
  Administered 2022-10-24: 50 ug via INTRAVENOUS
  Filled 2022-10-24: qty 1

## 2022-10-24 MED ORDER — PROCHLORPERAZINE EDISYLATE 10 MG/2ML IJ SOLN
10.0000 mg | Freq: Once | INTRAMUSCULAR | Status: AC
  Administered 2022-10-24: 10 mg via INTRAVENOUS
  Filled 2022-10-24: qty 2
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
## 2022-10-24 NOTE — ED Notes (Signed)
New green top send off.
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
## 2023-02-10 ENCOUNTER — Emergency Department (HOSPITAL_COMMUNITY)
Admission: EM | Admit: 2023-02-10 | Discharge: 2023-02-10 | Discharge status: Against Medical Advice | Payer: Commercial Managed Care - HMO

## 2023-02-10 NOTE — ED Notes (Signed)
Pt called x'3, no response 

## 2023-02-11 ENCOUNTER — Observation Stay (HOSPITAL_COMMUNITY): Payer: Commercial Managed Care - HMO

## 2023-02-11 ENCOUNTER — Other Ambulatory Visit: Payer: Self-pay

## 2023-02-11 ENCOUNTER — Encounter (HOSPITAL_COMMUNITY): Payer: Self-pay

## 2023-02-11 ENCOUNTER — Observation Stay (HOSPITAL_COMMUNITY)
Admission: EM | Admit: 2023-02-11 | Discharge: 2023-02-12 | Disposition: A | Discharge status: Home / Self Care | Payer: Commercial Managed Care - HMO | Attending: Internal Medicine | Admitting: Internal Medicine

## 2023-02-11 ENCOUNTER — Emergency Department (HOSPITAL_COMMUNITY): Payer: Commercial Managed Care - HMO

## 2023-02-11 DIAGNOSIS — F1010 Alcohol abuse, uncomplicated: Secondary | ICD-10-CM | POA: Diagnosis not present

## 2023-02-11 DIAGNOSIS — I10 Essential (primary) hypertension: Secondary | ICD-10-CM | POA: Diagnosis not present

## 2023-02-11 DIAGNOSIS — F10929 Alcohol use, unspecified with intoxication, unspecified: Secondary | ICD-10-CM | POA: Diagnosis not present

## 2023-02-11 DIAGNOSIS — K219 Gastro-esophageal reflux disease without esophagitis: Secondary | ICD-10-CM | POA: Insufficient documentation

## 2023-02-11 DIAGNOSIS — F17210 Nicotine dependence, cigarettes, uncomplicated: Secondary | ICD-10-CM

## 2023-02-11 DIAGNOSIS — R112 Nausea with vomiting, unspecified: Secondary | ICD-10-CM | POA: Diagnosis present

## 2023-02-11 DIAGNOSIS — R1084 Generalized abdominal pain: Secondary | ICD-10-CM

## 2023-02-11 DIAGNOSIS — F1910 Other psychoactive substance abuse, uncomplicated: Secondary | ICD-10-CM | POA: Diagnosis present

## 2023-02-11 DIAGNOSIS — Z79899 Other long term (current) drug therapy: Secondary | ICD-10-CM | POA: Diagnosis not present

## 2023-02-11 DIAGNOSIS — I161 Hypertensive emergency: Secondary | ICD-10-CM | POA: Diagnosis not present

## 2023-02-11 DIAGNOSIS — I160 Hypertensive urgency: Secondary | ICD-10-CM

## 2023-02-11 DIAGNOSIS — F17200 Nicotine dependence, unspecified, uncomplicated: Secondary | ICD-10-CM | POA: Insufficient documentation

## 2023-02-11 HISTORY — DX: Essential (primary) hypertension: I10

## 2023-02-11 LAB — COMPREHENSIVE METABOLIC PANEL
ALT: 27 U/L (ref 0–44)
AST: 29 U/L (ref 15–41)
Albumin: 4.4 g/dL (ref 3.5–5.0)
Alkaline Phosphatase: 107 U/L (ref 38–126)
Anion gap: 16 — ABNORMAL HIGH (ref 5–15)
BUN: 6 mg/dL (ref 6–20)
CO2: 22 mmol/L (ref 22–32)
Calcium: 9.7 mg/dL (ref 8.9–10.3)
Chloride: 102 mmol/L (ref 98–111)
Creatinine, Ser: 1 mg/dL (ref 0.61–1.24)
GFR, Estimated: >60 mL/min (ref >60)
Glucose, Bld: 129 mg/dL — ABNORMAL HIGH (ref 70–99)
Potassium: 3.7 mmol/L (ref 3.5–5.1)
Sodium: 140 mmol/L (ref 135–145)
Total Bilirubin: 0.5 mg/dL (ref 0.3–1.2)
Total Protein: 8 g/dL (ref 6.5–8.1)

## 2023-02-11 LAB — CBC WITH DIFFERENTIAL/PLATELET
Abs Immature Granulocytes: 0.04 10*3/uL (ref 0.00–0.07)
Basophils Absolute: 0 10*3/uL (ref 0.0–0.1)
Basophils Relative: 0 %
Eosinophils Absolute: 0 10*3/uL (ref 0.0–0.5)
Eosinophils Relative: 0 %
HCT: 47.2 % (ref 39.0–52.0)
Hemoglobin: 15.8 g/dL (ref 13.0–17.0)
Immature Granulocytes: 0 %
Lymphocytes Relative: 10 %
Lymphs Abs: 0.9 10*3/uL (ref 0.7–4.0)
MCH: 30.4 pg (ref 26.0–34.0)
MCHC: 33.5 g/dL (ref 30.0–36.0)
MCV: 90.8 fL (ref 80.0–100.0)
Monocytes Absolute: 0.2 10*3/uL (ref 0.1–1.0)
Monocytes Relative: 2 %
Neutro Abs: 8.3 10*3/uL — ABNORMAL HIGH (ref 1.7–7.7)
Neutrophils Relative %: 88 %
Platelets: 346 10*3/uL (ref 150–400)
RBC: 5.2 MIL/uL (ref 4.22–5.81)
RDW: 11.6 % (ref 11.5–15.5)
WBC: 9.5 10*3/uL (ref 4.0–10.5)
nRBC: 0 % (ref 0.0–0.2)

## 2023-02-11 LAB — RAPID URINE DRUG SCREEN, HOSP PERFORMED
Amphetamines: NOT DETECTED
Barbiturates: NOT DETECTED
Benzodiazepines: POSITIVE — AB
Cocaine: POSITIVE — AB
Opiates: NOT DETECTED
Tetrahydrocannabinol: POSITIVE — AB

## 2023-02-11 LAB — URINALYSIS, ROUTINE W REFLEX MICROSCOPIC
Bilirubin Urine: NEGATIVE
Glucose, UA: NEGATIVE mg/dL
Hgb urine dipstick: NEGATIVE
Ketones, ur: 20 mg/dL — AB
Leukocytes,Ua: NEGATIVE
Nitrite: NEGATIVE
Protein, ur: NEGATIVE mg/dL
Specific Gravity, Urine: 1.015 (ref 1.005–1.030)
pH: 7 (ref 5.0–8.0)

## 2023-02-11 LAB — LIPID PANEL
Cholesterol: 242 mg/dL — ABNORMAL HIGH (ref 0–200)
HDL: 75 mg/dL (ref >40)
LDL Cholesterol: 148 mg/dL — ABNORMAL HIGH (ref 0–99)
Total CHOL/HDL Ratio: 3.2 RATIO
Triglycerides: 93 mg/dL (ref <150)
VLDL: 19 mg/dL (ref 0–40)

## 2023-02-11 LAB — TROPONIN I (HIGH SENSITIVITY): Troponin I (High Sensitivity): 7 ng/L (ref <18)

## 2023-02-11 LAB — HIV ANTIBODY (ROUTINE TESTING W REFLEX): HIV Screen 4th Generation wRfx: NONREACTIVE

## 2023-02-11 LAB — LIPASE, BLOOD: Lipase: 29 U/L (ref 11–51)

## 2023-02-11 LAB — MAGNESIUM: Magnesium: 2.3 mg/dL (ref 1.7–2.4)

## 2023-02-11 LAB — HEMOGLOBIN A1C
Hgb A1c MFr Bld: 5.6 % (ref 4.8–5.6)
Mean Plasma Glucose: 114 mg/dL

## 2023-02-11 LAB — TSH: TSH: 0.836 u[IU]/mL (ref 0.350–4.500)

## 2023-02-11 MED ORDER — LACTATED RINGERS IV BOLUS
2000.0000 mL | Freq: Once | INTRAVENOUS | Status: AC
  Administered 2023-02-11: 2000 mL via INTRAVENOUS

## 2023-02-11 MED ORDER — PANTOPRAZOLE SODIUM 40 MG IV SOLR
40.0000 mg | Freq: Two times a day (BID) | INTRAVENOUS | Status: DC
  Administered 2023-02-11 – 2023-02-12 (×2): 40 mg via INTRAVENOUS
  Filled 2023-02-11 (×2): qty 10

## 2023-02-11 MED ORDER — NICOTINE 14 MG/24HR TD PT24
14.0000 mg | MEDICATED_PATCH | Freq: Every day | TRANSDERMAL | Status: DC
  Administered 2023-02-11: 14 mg via TRANSDERMAL
  Filled 2023-02-11: qty 1

## 2023-02-11 MED ORDER — THIAMINE MONONITRATE 100 MG PO TABS
100.0000 mg | ORAL_TABLET | Freq: Every day | ORAL | Status: DC
  Administered 2023-02-11: 100 mg via ORAL
  Filled 2023-02-11: qty 1

## 2023-02-11 MED ORDER — ENOXAPARIN SODIUM 40 MG/0.4ML IJ SOSY
40.0000 mg | PREFILLED_SYRINGE | INTRAMUSCULAR | Status: DC
  Filled 2023-02-11: qty 0.4

## 2023-02-11 MED ORDER — ACETAMINOPHEN 650 MG RE SUPP: 650.0000 mg | Freq: Four times a day (QID) | RECTAL | Status: DC | PRN

## 2023-02-11 MED ORDER — LABETALOL HCL 5 MG/ML IV SOLN
5.0000 mg | INTRAVENOUS | Status: DC | PRN
  Administered 2023-02-11 – 2023-02-12 (×2): 5 mg via INTRAVENOUS
  Filled 2023-02-11 (×2): qty 4

## 2023-02-11 MED ORDER — LORAZEPAM 1 MG PO TABS
1.0000 mg | ORAL_TABLET | ORAL | Status: DC | PRN
  Administered 2023-02-11 – 2023-02-12 (×3): 1 mg via ORAL
  Filled 2023-02-11: qty 2
  Filled 2023-02-11 (×2): qty 1

## 2023-02-11 MED ORDER — LABETALOL HCL 5 MG/ML IV SOLN
20.0000 mg | Freq: Once | INTRAVENOUS | Status: AC
  Administered 2023-02-11: 20 mg via INTRAVENOUS
  Filled 2023-02-11: qty 4

## 2023-02-11 MED ORDER — HYDRALAZINE HCL 20 MG/ML IJ SOLN
10.0000 mg | Freq: Once | INTRAMUSCULAR | Status: AC
  Administered 2023-02-11: 10 mg via INTRAVENOUS
  Filled 2023-02-11: qty 1

## 2023-02-11 MED ORDER — ACETAMINOPHEN 325 MG PO TABS
650.0000 mg | ORAL_TABLET | Freq: Four times a day (QID) | ORAL | Status: DC | PRN
  Administered 2023-02-11: 650 mg via ORAL
  Filled 2023-02-11: qty 2

## 2023-02-11 MED ORDER — IOHEXOL 350 MG/ML SOLN
75.0000 mL | Freq: Once | INTRAVENOUS | Status: AC | PRN
  Administered 2023-02-11: 75 mL via INTRAVENOUS

## 2023-02-11 MED ORDER — FOLIC ACID 1 MG PO TABS
1.0000 mg | ORAL_TABLET | Freq: Every day | ORAL | Status: DC
  Administered 2023-02-11: 1 mg via ORAL
  Filled 2023-02-11: qty 1

## 2023-02-11 MED ORDER — MELATONIN 3 MG PO TABS
3.0000 mg | ORAL_TABLET | Freq: Every day | ORAL | Status: DC
  Administered 2023-02-11: 3 mg via ORAL
  Filled 2023-02-11: qty 1

## 2023-02-11 MED ORDER — DROPERIDOL 2.5 MG/ML IJ SOLN
1.2500 mg | Freq: Once | INTRAMUSCULAR | Status: AC
  Administered 2023-02-11: 1.25 mg via INTRAVENOUS
  Filled 2023-02-11: qty 2

## 2023-02-11 MED ORDER — DIAZEPAM 5 MG/ML IJ SOLN
5.0000 mg | Freq: Once | INTRAMUSCULAR | Status: AC
  Administered 2023-02-11: 5 mg via INTRAVENOUS
  Filled 2023-02-11: qty 2

## 2023-02-11 MED ORDER — AMLODIPINE BESYLATE 5 MG PO TABS
5.0000 mg | ORAL_TABLET | Freq: Every day | ORAL | Status: DC
  Administered 2023-02-11 – 2023-02-12 (×2): 5 mg via ORAL
  Filled 2023-02-11 (×2): qty 1

## 2023-02-11 MED ORDER — ADULT MULTIVITAMIN W/MINERALS CH
1.0000 | ORAL_TABLET | Freq: Every day | ORAL | Status: DC
  Administered 2023-02-11 – 2023-02-12 (×2): 1 via ORAL
  Filled 2023-02-11 (×2): qty 1

## 2023-02-11 MED ORDER — LOSARTAN POTASSIUM 50 MG PO TABS
50.0000 mg | ORAL_TABLET | Freq: Every day | ORAL | Status: DC
  Administered 2023-02-11 – 2023-02-12 (×2): 50 mg via ORAL
  Filled 2023-02-11 (×2): qty 1

## 2023-02-11 NOTE — ED Notes (Signed)
Urinal placed at bedside.

## 2023-02-11 NOTE — Progress Notes (Signed)
New Admission Note:   Arrival Method: Stretcher Mental Orientation: alert x 4 Telemetry: none Assessment: Completed Skin:see flowsheet IV: RT forearm NSL Pain: 10 Tubes: none Safety Measures: Safety Fall Prevention Plan has been discussed Admission: Completed [facility] Orientation: Patient has been orientated to the room, unit and staff.  Family: none at bedside  Orders have been reviewed and implemented. Will continue to monitor the patient. Call light has been placed within reach and bed alarm has been activated.   Rockie Neighbours BSN, RN Phone number: [phone]
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
## 2023-02-11 NOTE — ED Notes (Signed)
MD notified about BP
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
## 2023-02-11 NOTE — ED Notes (Signed)
Patient is resting comfortably. 

## 2023-02-11 NOTE — ED Notes (Signed)
Pt refusing CT at this time
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
## 2023-02-11 NOTE — ED Triage Notes (Signed)
Pt BIB EMS due to abd pain in the center of abd for 2 days and nausea and vomiting. Pt states 10/10 pain. Pt hx of HTN.

## 2023-02-11 NOTE — Plan of Care (Signed)
  Problem: Education: Goal: Knowledge of General Education information will improve Description: Including pain rating scale, medication(s)/side effects and non-pharmacologic comfort measures Outcome: Progressing   Problem: Safety: Goal: Ability to remain free from injury will improve Outcome: Progressing   

## 2023-02-12 ENCOUNTER — Other Ambulatory Visit: Payer: Self-pay | Admitting: Internal Medicine

## 2023-02-12 DIAGNOSIS — F17210 Nicotine dependence, cigarettes, uncomplicated: Secondary | ICD-10-CM | POA: Diagnosis not present

## 2023-02-12 DIAGNOSIS — I10 Essential (primary) hypertension: Secondary | ICD-10-CM | POA: Diagnosis not present

## 2023-02-12 DIAGNOSIS — K219 Gastro-esophageal reflux disease without esophagitis: Secondary | ICD-10-CM | POA: Diagnosis not present

## 2023-02-12 DIAGNOSIS — F1010 Alcohol abuse, uncomplicated: Secondary | ICD-10-CM | POA: Diagnosis present

## 2023-02-12 DIAGNOSIS — R1084 Generalized abdominal pain: Secondary | ICD-10-CM

## 2023-02-12 LAB — BASIC METABOLIC PANEL
Anion gap: 9 (ref 5–15)
BUN: 6 mg/dL (ref 6–20)
CO2: 25 mmol/L (ref 22–32)
Calcium: 9.1 mg/dL (ref 8.9–10.3)
Chloride: 102 mmol/L (ref 98–111)
Creatinine, Ser: 1.02 mg/dL (ref 0.61–1.24)
GFR, Estimated: >60 mL/min (ref >60)
Glucose, Bld: 111 mg/dL — ABNORMAL HIGH (ref 70–99)
Potassium: 3.4 mmol/L — ABNORMAL LOW (ref 3.5–5.1)
Sodium: 136 mmol/L (ref 135–145)

## 2023-02-12 LAB — CBC
HCT: 45.5 % (ref 39.0–52.0)
Hemoglobin: 15.8 g/dL (ref 13.0–17.0)
MCH: 31.1 pg (ref 26.0–34.0)
MCHC: 34.7 g/dL (ref 30.0–36.0)
MCV: 89.6 fL (ref 80.0–100.0)
Platelets: 316 10*3/uL (ref 150–400)
RBC: 5.08 MIL/uL (ref 4.22–5.81)
RDW: 11.9 % (ref 11.5–15.5)
WBC: 14.4 10*3/uL — ABNORMAL HIGH (ref 4.0–10.5)
nRBC: 0 % (ref 0.0–0.2)

## 2023-02-12 MED ORDER — LOSARTAN POTASSIUM 50 MG PO TABS: 50.0000 mg | ORAL_TABLET | Freq: Every day | ORAL | 0 refills | Status: DC

## 2023-02-12 MED ORDER — PANTOPRAZOLE SODIUM 40 MG PO TBEC: 40.0000 mg | DELAYED_RELEASE_TABLET | Freq: Every day | ORAL | 0 refills | Status: DC

## 2023-02-12 MED ORDER — HYDROCHLOROTHIAZIDE 12.5 MG PO TABS: 12.5000 mg | ORAL_TABLET | Freq: Every day | ORAL | 0 refills | Status: DC

## 2023-02-12 MED ORDER — ATORVASTATIN CALCIUM 40 MG PO TABS
40.0000 mg | ORAL_TABLET | Freq: Every day | ORAL | Status: DC
  Administered 2023-02-12: 40 mg via ORAL
  Filled 2023-02-12: qty 1

## 2023-02-12 MED ORDER — FOLIC ACID 1 MG PO TABS: 1.0000 mg | ORAL_TABLET | Freq: Every day | ORAL | 0 refills | Status: DC

## 2023-02-12 MED ORDER — SUCRALFATE 1 G PO TABS
1.0000 g | ORAL_TABLET | Freq: Three times a day (TID) | ORAL | Status: DC
  Administered 2023-02-12: 1 g via ORAL
  Filled 2023-02-12: qty 1

## 2023-02-12 MED ORDER — VITAMIN B-1 100 MG PO TABS: 100.0000 mg | ORAL_TABLET | Freq: Every day | ORAL | 0 refills | Status: DC

## 2023-02-12 MED ORDER — ATORVASTATIN CALCIUM 40 MG PO TABS: 40.0000 mg | ORAL_TABLET | Freq: Every day | ORAL | 0 refills | Status: DC

## 2023-02-12 MED ORDER — PANTOPRAZOLE SODIUM 40 MG PO TBEC: 40.0000 mg | DELAYED_RELEASE_TABLET | Freq: Every day | ORAL | Status: DC

## 2023-02-12 MED ORDER — HYDROCHLOROTHIAZIDE 12.5 MG PO TABS
12.5000 mg | ORAL_TABLET | Freq: Every day | ORAL | Status: DC
  Administered 2023-02-12: 12.5 mg via ORAL
  Filled 2023-02-12: qty 1

## 2023-02-12 MED ORDER — SUCRALFATE 1 GM/10ML PO SUSP: 1.0000 g | Freq: Three times a day (TID) | ORAL | Status: DC

## 2023-02-12 MED ORDER — SUCRALFATE 1 G PO TABS: 1.0000 g | ORAL_TABLET | Freq: Three times a day (TID) | ORAL | 0 refills | Status: DC

## 2023-02-12 NOTE — Progress Notes (Signed)
Discharged done by Leretha Dykes. Patient left via wheelchair in distress via mother.
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
## 2023-02-12 NOTE — TOC Transition Note (Signed)
Transition of Care [facility]) - CM/SW Discharge Note   Patient Details  Name: Raymond Ford MRN: [id number] Date of Birth: 12-15-1975  [facility]) CM/SW Contact:  Tom-Johnson, Renea Ee, RN Phone Number: 02/12/2023, 1:57 PM   Clinical Narrative:     Patient is scheduled for discharge today. New patient establishment and hospital with and discharge instructions on AVS.  Family to transport at discharge. No further TOC needs noted.    Final next level of care: Home/Self Care Barriers to Discharge: Barriers Resolved   Patient Goals and CMS Choice CMS Medicare.gov Compare Post Acute Care list provided to:: Patient Choice offered to / list presented to : Patient  Discharge Placement                  Patient to be transferred to facility by: Family      Discharge Plan and Services Additional resources added to the After Visit Summary for                  DME Arranged: N/A DME Agency: NA       HH Arranged: NA HH Agency: NA        Social Determinants of Health (SDOH) Interventions SDOH Screenings   Food Insecurity: No Food Insecurity (02/11/2023)  Housing: Low Risk  (02/11/2023)  Transportation Needs: No Transportation Needs (02/11/2023)  Utilities: Not At Risk (02/11/2023)  Depression (PHQ2-9): High Risk (10/30/2021)  Tobacco Use: High Risk (02/11/2023)     Readmission Risk Interventions     No data to display
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
## 2023-02-12 NOTE — Discharge Instructions (Addendum)
Dear Raymond Ford, it has been a pleasure caring for you and I am so happy to see you doing well! You were hospitalized for abdominal pain and treated for hypertensive emergency and abdominal pain.   When you are discharged we would like you to do the following:  1. You were started on the following medications in the hospital. Please continue them using these following instructions:  - Continue amlodipine, losartan, HCTZ, carafate, protonix, and lipitor  2. Follow-up with the Primary Care Physician:   - 02/19/2023 at 1:15 pm at [facility] with Dr. Coy Saunas. Address is [address]; [facility], [place], [place] [address].        Outpatient Providers   [facility] ([facility]) Group and individual counseling. [address]  [place], [place] [address] [phone] [place]: [phone]  [place]: [phone] Medicaid and uninsured.   The [place] IOP groups multiple times per week. [place], [place], [place] [address] [phone] Takes Medicaid and other insurances.   [facility]  Chemical Dependency Intensive Outpatient Program (IOP) [address] [place], [place] [address] [phone] Takes Pharmacist, community and [place].   [facility]  IOP and Partial Hospitalization Program  [place].  [place], [place] [address] [phone] Private Insurance, [place] only for partial hospitalization     [facility] ([place]) IOP, individual counseling, medication management [place], [place] [address] [phone] Medicaid and [facility]  [place] [address]  [place], [place] [address] [phone] Private Insurance and [facility] [address]  [place], [place] [address] [phone] Private Insurance, [place], and Self Pay    [facility]: [facility]one Clinic  [place], [place] [address] [facility]  [address]  [place], [place] [address] [phone]  [facility]  [address] [place], [place] [address] [phone]       Residential Treatment Programs  [facility] ([place], [place] [address] [phone] or [phone] Detox and Residential Rehab 14 days (Medicare, Medicaid, private insurance, and self pay)  [facility] [place], [place] [address] [phone] Detox (self Pay and Medicaid Limited availability) Rehab Only Male (Medicare, [place], and Self Pay)  [facility] [address] [place], [place] [address] [phone] or [phone] Private Insurance only  [place]  [place], [place] [address] [phone] Treatment Only, must make assessment appointment, and must be sober for assessment appointment. Self pay, Medicare A and B, [facility], must be [facility] resident.      Residential Treatment [place] , [place]  [phone] [employer], [place], [place]. They offer assistance with transportation.   [facility] [address] [place],  [place], [place] [address] [phone] [facility] No insurance     [place], [place] [address] [phone] No pending legal charges, Long-term work program  [facility]: [facility]  [place],  [address] [address], [place],  [address] Residential treatment (takes people on Methadone/Suboxone)  [place] and uninsured  [facility]  [address] [place],  [place] [address] [phone] or [phone] [place] [phone] Private Insurance Males/Females, call to make referrals, multiple facilities   [facility] [address]  [place], [place] [address]  [phone] Men Only Upfront Fee [facility] [address]      [facility] Women's Program: [facility] [place], [place] [address] [phone]  [place] [facility] [place], [place] [address] [phone] (f)  [place], [place] [address] [phone] (f)       Syringe Services Program Due to COVID-19, syringe services programs are likely operating under different hours with limited or no fixed site hours. Some programs may not be operating at all. Please contact the program directly using the phone numbers provided below to see if they are still operating under COVID-19.  [facility] Solution to the Opioid Problem (GCSTOP) Fixed; mobile; peer-based [employer] [phone] [email or web address] Fixed site exchange at [facility], [place]. [place], [place] [address] on Wednesdays (2:00 - 5:00 pm) and Thursdays (4:00 - 8:00 pm). Pop-up mobile exchange locations: [employer] and [employer], [address] [place], [place] [address] on Tuesdays (11:00 am - 1:00 pm) and Fridays (11:00 am - 1:00 pm) [place] [address], [place], [place] [address] on Tuesdays (2:00 - 4:00 pm) and Fridays (2:00 - 4:00 pm) [facility] - also serves [place] and [employer] [place] [employer] Fixed; mobile; peer-based Rosemary Holms [phone] [email or web address] [address] [place], [place] [address] Delivery and outreach available in [place] and [place], please call for more information. Monday, Tuesday: 1:00 -7:00 pm Thursday: 4:00 pm - 8:00 pm Friday: 1:00 pm - 8:00 pm)
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
## 2023-02-12 NOTE — Plan of Care (Signed)
  Problem: Education: Goal: Knowledge of General Education information will improve Description: Including pain rating scale, medication(s)/side effects and non-pharmacologic comfort measures Outcome: Adequate for Discharge   Problem: Health Behavior/Discharge Planning: Goal: Ability to manage health-related needs will improve Outcome: Adequate for Discharge   Problem: Clinical Measurements: Goal: Ability to maintain clinical measurements within normal limits will improve Outcome: Adequate for Discharge Goal: Will remain free from infection Outcome: Adequate for Discharge Goal: Diagnostic test results will improve Outcome: Adequate for Discharge Goal: Respiratory complications will improve Outcome: Adequate for Discharge Goal: Cardiovascular complication will be avoided Outcome: Adequate for Discharge   Problem: Activity: Goal: Risk for activity intolerance will decrease Outcome: Adequate for Discharge   Problem: Nutrition: Goal: Adequate nutrition will be maintained Outcome: Adequate for Discharge   Problem: Elimination: Goal: Will not experience complications related to bowel motility Outcome: Adequate for Discharge Goal: Will not experience complications related to urinary retention Outcome: Adequate for Discharge   Problem: Pain Managment: Goal: General experience of comfort will improve Outcome: Adequate for Discharge   Problem: Safety: Goal: Ability to remain free from injury will improve Outcome: Adequate for Discharge   Problem: Skin Integrity: Goal: Risk for impaired skin integrity will decrease Outcome: Adequate for Discharge   

## 2023-02-19 ENCOUNTER — Encounter: Payer: Commercial Managed Care - HMO | Admitting: Student

## 2023-02-23 ENCOUNTER — Ambulatory Visit (HOSPITAL_COMMUNITY): Payer: Commercial Managed Care - HMO

## 2023-02-24 ENCOUNTER — Ambulatory Visit (HOSPITAL_COMMUNITY): Payer: Commercial Managed Care - HMO

## 2023-02-24 ENCOUNTER — Encounter: Payer: Commercial Managed Care - HMO | Admitting: Student

## 2023-02-25 ENCOUNTER — Ambulatory Visit (HOSPITAL_COMMUNITY)
Admission: RE | Admit: 2023-02-25 | Discharge: 2023-02-25 | Disposition: A | Discharge status: Home / Self Care | Payer: Commercial Managed Care - HMO | Source: Ambulatory Visit | Attending: Emergency Medicine | Admitting: Emergency Medicine

## 2023-02-25 ENCOUNTER — Encounter (HOSPITAL_COMMUNITY): Payer: Self-pay

## 2023-02-25 VITALS — BP 146/98 | HR 76 | Temp 99.4°F | Resp 18 | Ht 71.0 in | Wt 187.0 lb

## 2023-02-25 DIAGNOSIS — R369 Urethral discharge, unspecified: Secondary | ICD-10-CM | POA: Diagnosis present

## 2023-02-25 DIAGNOSIS — Z202 Contact with and (suspected) exposure to infections with a predominantly sexual mode of transmission: Secondary | ICD-10-CM | POA: Diagnosis not present

## 2023-02-25 LAB — POCT URINALYSIS DIPSTICK, ED / UC
Bilirubin Urine: NEGATIVE
Glucose, UA: NEGATIVE mg/dL
Ketones, ur: NEGATIVE mg/dL
Nitrite: NEGATIVE
Protein, ur: NEGATIVE mg/dL
Specific Gravity, Urine: >=1.03 (ref 1.005–1.030)
Urobilinogen, UA: 0.2 mg/dL (ref 0.0–1.0)
pH: 5.5 (ref 5.0–8.0)
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
## 2023-02-25 NOTE — ED Triage Notes (Signed)
Patient exposed to STD but has not been told what it was. Patient having burning with urination and discharge. No rash. Onset of symptoms 2 days ago.

## 2023-02-25 NOTE — Discharge Instructions (Addendum)
Today you have been screened for sexually transmitted infections, these results should be back by tomorrow, we will call if positive and treat accordingly.  Please return to clinic if you develop any new or concerning symptoms.

## 2023-02-26 ENCOUNTER — Encounter (HOSPITAL_COMMUNITY): Payer: Self-pay

## 2023-02-26 ENCOUNTER — Telehealth (HOSPITAL_COMMUNITY): Payer: Self-pay | Admitting: *Deleted

## 2023-02-26 ENCOUNTER — Ambulatory Visit (HOSPITAL_COMMUNITY)
Admission: EM | Admit: 2023-02-26 | Discharge: 2023-02-26 | Disposition: A | Discharge status: Home / Self Care | Payer: Commercial Managed Care - HMO | Attending: Emergency Medicine | Admitting: Emergency Medicine

## 2023-02-26 DIAGNOSIS — A549 Gonococcal infection, unspecified: Secondary | ICD-10-CM

## 2023-02-26 LAB — CYTOLOGY, (ORAL, ANAL, URETHRAL) ANCILLARY ONLY
Chlamydia: NEGATIVE
Comment: NEGATIVE
Comment: NEGATIVE
Comment: NORMAL
Neisseria Gonorrhea: POSITIVE — AB
Trichomonas: POSITIVE — AB

## 2023-02-26 MED ORDER — METRONIDAZOLE 500 MG PO TABS: 500.0000 mg | ORAL_TABLET | Freq: Two times a day (BID) | ORAL | 0 refills | Status: AC

## 2023-02-26 MED ORDER — CEFTRIAXONE SODIUM 500 MG IJ SOLR
INTRAMUSCULAR | Status: AC
  Filled 2023-02-26: qty 500

## 2023-02-26 MED ORDER — CEFTRIAXONE SODIUM 500 MG IJ SOLR
500.0000 mg | INTRAMUSCULAR | Status: DC
  Administered 2023-02-26: 500 mg via INTRAMUSCULAR

## 2023-02-26 NOTE — ED Triage Notes (Signed)
--  Pt is here for STD-treatment for Gonorrhea. Pt given Rocephin 500 mg IM. NDC# [phone]

## 2023-02-26 NOTE — Telephone Encounter (Signed)
Returned patients call about STI labs advised pt he would need to come in for treatment of Gonorrhea and that meds can be called in for the Trich. He states he uses [facility] on [address] and he will come in today for treatment.

## 2023-03-11 ENCOUNTER — Other Ambulatory Visit: Payer: Self-pay | Admitting: Internal Medicine

## 2023-03-12 ENCOUNTER — Other Ambulatory Visit: Payer: Self-pay | Admitting: Internal Medicine

## 2023-03-12 DIAGNOSIS — E785 Hyperlipidemia, unspecified: Secondary | ICD-10-CM

## 2023-03-12 DIAGNOSIS — F1010 Alcohol abuse, uncomplicated: Secondary | ICD-10-CM

## 2023-03-12 DIAGNOSIS — K219 Gastro-esophageal reflux disease without esophagitis: Secondary | ICD-10-CM

## 2023-03-12 DIAGNOSIS — I10 Essential (primary) hypertension: Secondary | ICD-10-CM

## 2023-03-13 ENCOUNTER — Other Ambulatory Visit: Payer: Self-pay | Admitting: Internal Medicine

## 2023-03-13 DIAGNOSIS — I10 Essential (primary) hypertension: Secondary | ICD-10-CM
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
## 2023-03-26 ENCOUNTER — Encounter: Payer: Commercial Managed Care - HMO | Admitting: Internal Medicine

## 2023-03-26 ENCOUNTER — Encounter: Payer: Self-pay | Admitting: Internal Medicine
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
## 2023-03-27 ENCOUNTER — Encounter: Payer: Commercial Managed Care - HMO | Admitting: Student

## 2023-04-07 ENCOUNTER — Encounter: Payer: Commercial Managed Care - HMO | Admitting: Student

## 2023-04-21 ENCOUNTER — Encounter: Payer: Commercial Managed Care - HMO | Admitting: Student

## 2023-05-05 ENCOUNTER — Emergency Department (HOSPITAL_COMMUNITY)
Admission: EM | Admit: 2023-05-05 | Discharge: 2023-05-05 | Disposition: A | Discharge status: Home / Self Care | Payer: Commercial Managed Care - HMO | Attending: Emergency Medicine | Admitting: Emergency Medicine

## 2023-05-05 ENCOUNTER — Encounter (HOSPITAL_COMMUNITY): Payer: Self-pay

## 2023-05-05 ENCOUNTER — Emergency Department (HOSPITAL_COMMUNITY): Payer: Commercial Managed Care - HMO

## 2023-05-05 ENCOUNTER — Other Ambulatory Visit: Payer: Self-pay

## 2023-05-05 DIAGNOSIS — G8929 Other chronic pain: Secondary | ICD-10-CM | POA: Diagnosis not present

## 2023-05-05 DIAGNOSIS — M79605 Pain in left leg: Secondary | ICD-10-CM | POA: Insufficient documentation

## 2023-05-05 LAB — CBC
HCT: 46.3 % (ref 39.0–52.0)
Hemoglobin: 15.6 g/dL (ref 13.0–17.0)
MCH: 30.7 pg (ref 26.0–34.0)
MCHC: 33.7 g/dL (ref 30.0–36.0)
MCV: 91.1 fL (ref 80.0–100.0)
Platelets: 353 10*3/uL (ref 150–400)
RBC: 5.08 MIL/uL (ref 4.22–5.81)
RDW: 12.1 % (ref 11.5–15.5)
WBC: 9.7 10*3/uL (ref 4.0–10.5)
nRBC: 0 % (ref 0.0–0.2)

## 2023-05-05 MED ORDER — KETOROLAC TROMETHAMINE 15 MG/ML IJ SOLN
15.0000 mg | Freq: Once | INTRAMUSCULAR | Status: AC
  Administered 2023-05-05: 15 mg via INTRAMUSCULAR
  Filled 2023-05-05: qty 1

## 2023-05-05 MED ORDER — OXYCODONE HCL 5 MG PO TABS
5.0000 mg | ORAL_TABLET | ORAL | Status: AC
  Administered 2023-05-05: 5 mg via ORAL
  Filled 2023-05-05: qty 1

## 2023-05-05 MED ORDER — DIAZEPAM 5 MG PO TABS
5.0000 mg | ORAL_TABLET | Freq: Once | ORAL | Status: AC
  Administered 2023-05-05: 5 mg via ORAL
  Filled 2023-05-05: qty 1

## 2023-05-05 MED ORDER — METHOCARBAMOL 500 MG PO TABS: 500.0000 mg | ORAL_TABLET | Freq: Two times a day (BID) | ORAL | 0 refills | Status: DC
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
## 2023-06-19 ENCOUNTER — Ambulatory Visit (HOSPITAL_COMMUNITY): Payer: Commercial Managed Care - HMO

## 2023-08-11 ENCOUNTER — Other Ambulatory Visit: Payer: Self-pay

## 2023-08-11 ENCOUNTER — Encounter (HOSPITAL_COMMUNITY): Payer: Self-pay | Admitting: Emergency Medicine

## 2023-08-11 ENCOUNTER — Emergency Department (HOSPITAL_COMMUNITY)
Admission: EM | Admit: 2023-08-11 | Discharge: 2023-08-11 | Disposition: A | Discharge status: Home / Self Care | Payer: Commercial Managed Care - HMO | Attending: Emergency Medicine | Admitting: Emergency Medicine

## 2023-08-11 DIAGNOSIS — T40421A Poisoning by tramadol, accidental (unintentional), initial encounter: Secondary | ICD-10-CM | POA: Insufficient documentation

## 2023-08-11 DIAGNOSIS — T391X1A Poisoning by 4-Aminophenol derivatives, accidental (unintentional), initial encounter: Secondary | ICD-10-CM | POA: Insufficient documentation

## 2023-08-11 DIAGNOSIS — T480X1A Poisoning by oxytocic drugs, accidental (unintentional), initial encounter: Secondary | ICD-10-CM | POA: Diagnosis not present

## 2023-08-11 DIAGNOSIS — I10 Essential (primary) hypertension: Secondary | ICD-10-CM | POA: Diagnosis not present

## 2023-08-11 DIAGNOSIS — R1084 Generalized abdominal pain: Secondary | ICD-10-CM | POA: Diagnosis present

## 2023-08-11 DIAGNOSIS — F1990 Other psychoactive substance use, unspecified, uncomplicated: Secondary | ICD-10-CM

## 2023-08-11 DIAGNOSIS — Z79899 Other long term (current) drug therapy: Secondary | ICD-10-CM | POA: Diagnosis not present

## 2023-08-11 DIAGNOSIS — R1013 Epigastric pain: Secondary | ICD-10-CM | POA: Diagnosis not present

## 2023-08-11 LAB — BASIC METABOLIC PANEL
Anion gap: 13 (ref 5–15)
BUN: 10 mg/dL (ref 6–20)
CO2: 20 mmol/L — ABNORMAL LOW (ref 22–32)
Calcium: 9.8 mg/dL (ref 8.9–10.3)
Chloride: 102 mmol/L (ref 98–111)
Creatinine, Ser: 0.95 mg/dL (ref 0.61–1.24)
GFR, Estimated: >60 mL/min (ref >60)
Glucose, Bld: 134 mg/dL — ABNORMAL HIGH (ref 70–99)
Potassium: 3.7 mmol/L (ref 3.5–5.1)
Sodium: 135 mmol/L (ref 135–145)

## 2023-08-11 LAB — CBC WITH DIFFERENTIAL/PLATELET
Abs Immature Granulocytes: 0.03 10*3/uL (ref 0.00–0.07)
Basophils Absolute: 0 10*3/uL (ref 0.0–0.1)
Basophils Relative: 0 %
Eosinophils Absolute: 0 10*3/uL (ref 0.0–0.5)
Eosinophils Relative: 0 %
HCT: 45.7 % (ref 39.0–52.0)
Hemoglobin: 15.8 g/dL (ref 13.0–17.0)
Immature Granulocytes: 0 %
Lymphocytes Relative: 15 %
Lymphs Abs: 1.3 10*3/uL (ref 0.7–4.0)
MCH: 29.9 pg (ref 26.0–34.0)
MCHC: 34.6 g/dL (ref 30.0–36.0)
MCV: 86.4 fL (ref 80.0–100.0)
Monocytes Absolute: 0.6 10*3/uL (ref 0.1–1.0)
Monocytes Relative: 7 %
Neutro Abs: 6.6 10*3/uL (ref 1.7–7.7)
Neutrophils Relative %: 78 %
Platelets: 385 10*3/uL (ref 150–400)
RBC: 5.29 MIL/uL (ref 4.22–5.81)
RDW: 11.6 % (ref 11.5–15.5)
WBC: 8.6 10*3/uL (ref 4.0–10.5)
nRBC: 0 % (ref 0.0–0.2)

## 2023-08-11 LAB — HEPATIC FUNCTION PANEL
ALT: 42 U/L (ref 0–44)
AST: 29 U/L (ref 15–41)
Albumin: 4.2 g/dL (ref 3.5–5.0)
Alkaline Phosphatase: 107 U/L (ref 38–126)
Bilirubin, Direct: 0.1 mg/dL (ref 0.0–0.2)
Indirect Bilirubin: 0.6 mg/dL (ref 0.3–0.9)
Total Bilirubin: 0.7 mg/dL (ref 0.3–1.2)
Total Protein: 8.7 g/dL — ABNORMAL HIGH (ref 6.5–8.1)

## 2023-08-11 LAB — ETHANOL: Alcohol, Ethyl (B): <10 mg/dL (ref <10)

## 2023-08-11 LAB — ACETAMINOPHEN LEVEL: Acetaminophen (Tylenol), Serum: 11 ug/mL (ref 10–30)

## 2023-08-11 LAB — LIPASE, BLOOD: Lipase: 27 U/L (ref 11–51)

## 2023-08-11 MED ORDER — CEFAZOLIN SODIUM-DEXTROSE 1-4 GM/50ML-% IV SOLN: 1.0000 g | Freq: Once | INTRAVENOUS | Status: DC

## 2023-08-11 MED ORDER — LORAZEPAM 1 MG PO TABS
2.0000 mg | ORAL_TABLET | Freq: Once | ORAL | Status: AC
  Administered 2023-08-11: 2 mg via ORAL
  Filled 2023-08-11: qty 2

## 2023-08-11 NOTE — Discharge Instructions (Addendum)
You were seen today for abdominal pain.  Your workup is reassuring.  You should only take medications as prescribed.  Do not take more than 2 g of Tylenol per day.

## 2023-08-11 NOTE — ED Triage Notes (Signed)
BIBA Per EMS: Pt coming in for overdose of multiple substances. Pt family noticed he was nodding off which is why they called EMS. Pt c/o N/V abd pain
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
## 2023-08-31 ENCOUNTER — Ambulatory Visit (HOSPITAL_COMMUNITY): Payer: Self-pay

## 2023-10-15 ENCOUNTER — Ambulatory Visit (HOSPITAL_COMMUNITY): Payer: Self-pay

## 2023-11-17 ENCOUNTER — Emergency Department (HOSPITAL_COMMUNITY)
Admission: EM | Admit: 2023-11-17 | Discharge: 2023-11-17 | Discharge status: Against Medical Advice | Payer: Commercial Managed Care - HMO | Attending: Emergency Medicine | Admitting: Emergency Medicine

## 2023-11-17 ENCOUNTER — Encounter (HOSPITAL_COMMUNITY): Payer: Self-pay

## 2023-11-17 ENCOUNTER — Other Ambulatory Visit: Payer: Self-pay

## 2023-11-17 DIAGNOSIS — R079 Chest pain, unspecified: Secondary | ICD-10-CM | POA: Insufficient documentation

## 2023-11-17 DIAGNOSIS — Z5321 Procedure and treatment not carried out due to patient leaving prior to being seen by health care provider: Secondary | ICD-10-CM | POA: Insufficient documentation

## 2023-11-17 DIAGNOSIS — G43909 Migraine, unspecified, not intractable, without status migrainosus: Secondary | ICD-10-CM | POA: Insufficient documentation

## 2023-11-17 NOTE — ED Notes (Signed)
Advised patient he needed to stay with his BP being so high and patient continued to leave.

## 2023-11-17 NOTE — ED Triage Notes (Signed)
Patient restless and wont sit still pacing around the room and diaphoretic.  When trying to question patient about what is bringing him in he keeps bouncing from "my throat is locking up, my chest hurts, I have a migraine, I can't swallow and NO I havent been drinking".  Patient has been seen for similar several times over the year.  Patient has hx of cocaine and marijuana abuse along with drinking.  Patient got angry because we could not give him anything at this particular moment and walked out saying he was leaving.

## 2024-01-17 ENCOUNTER — Ambulatory Visit (HOSPITAL_COMMUNITY): Payer: Self-pay

## 2024-02-24 ENCOUNTER — Other Ambulatory Visit: Payer: Self-pay

## 2024-02-24 ENCOUNTER — Emergency Department (HOSPITAL_COMMUNITY): Payer: Self-pay

## 2024-02-24 ENCOUNTER — Encounter (HOSPITAL_COMMUNITY): Payer: Self-pay | Admitting: Emergency Medicine

## 2024-02-24 ENCOUNTER — Emergency Department (HOSPITAL_COMMUNITY)
Admission: EM | Admit: 2024-02-24 | Discharge: 2024-02-25 | Disposition: A | Discharge status: Home / Self Care | Payer: Self-pay | Attending: Emergency Medicine | Admitting: Emergency Medicine

## 2024-02-24 DIAGNOSIS — F17200 Nicotine dependence, unspecified, uncomplicated: Secondary | ICD-10-CM | POA: Insufficient documentation

## 2024-02-24 DIAGNOSIS — E860 Dehydration: Secondary | ICD-10-CM | POA: Insufficient documentation

## 2024-02-24 DIAGNOSIS — R112 Nausea with vomiting, unspecified: Secondary | ICD-10-CM | POA: Insufficient documentation

## 2024-02-24 DIAGNOSIS — R197 Diarrhea, unspecified: Secondary | ICD-10-CM | POA: Diagnosis not present

## 2024-02-24 DIAGNOSIS — D72829 Elevated white blood cell count, unspecified: Secondary | ICD-10-CM | POA: Diagnosis not present

## 2024-02-24 DIAGNOSIS — R1013 Epigastric pain: Secondary | ICD-10-CM | POA: Insufficient documentation

## 2024-02-24 LAB — COMPREHENSIVE METABOLIC PANEL
ALT: 22 U/L (ref 0–44)
AST: 24 U/L (ref 15–41)
Albumin: 4.5 g/dL (ref 3.5–5.0)
Alkaline Phosphatase: 84 U/L (ref 38–126)
Anion gap: 10 (ref 5–15)
BUN: 8 mg/dL (ref 6–20)
CO2: 27 mmol/L (ref 22–32)
Calcium: 9.8 mg/dL (ref 8.9–10.3)
Chloride: 100 mmol/L (ref 98–111)
Creatinine, Ser: 0.96 mg/dL (ref 0.61–1.24)
GFR, Estimated: >60 mL/min (ref >60)
Glucose, Bld: 113 mg/dL — ABNORMAL HIGH (ref 70–99)
Potassium: 4.1 mmol/L (ref 3.5–5.1)
Sodium: 137 mmol/L (ref 135–145)
Total Bilirubin: 0.7 mg/dL (ref 0.0–1.2)
Total Protein: 8.7 g/dL — ABNORMAL HIGH (ref 6.5–8.1)

## 2024-02-24 LAB — TYPE AND SCREEN
ABO/RH(D): O POS
Antibody Screen: NEGATIVE

## 2024-02-24 LAB — CBC
HCT: 46 % (ref 39.0–52.0)
Hemoglobin: 15.5 g/dL (ref 13.0–17.0)
MCH: 31.1 pg (ref 26.0–34.0)
MCHC: 33.7 g/dL (ref 30.0–36.0)
MCV: 92.4 fL (ref 80.0–100.0)
Platelets: 353 10*3/uL (ref 150–400)
RBC: 4.98 MIL/uL (ref 4.22–5.81)
RDW: 12.2 % (ref 11.5–15.5)
WBC: 11.5 10*3/uL — ABNORMAL HIGH (ref 4.0–10.5)
nRBC: 0 % (ref 0.0–0.2)

## 2024-02-24 LAB — LIPASE, BLOOD: Lipase: 23 U/L (ref 11–51)

## 2024-02-24 MED ORDER — OXYCODONE-ACETAMINOPHEN 5-325 MG PO TABS
1.0000 | ORAL_TABLET | Freq: Once | ORAL | Status: AC
  Administered 2024-02-24: 1 via ORAL
  Filled 2024-02-24: qty 1

## 2024-02-24 MED ORDER — ONDANSETRON 4 MG PO TBDP
4.0000 mg | ORAL_TABLET | Freq: Once | ORAL | Status: AC
  Administered 2024-02-24: 4 mg via ORAL
  Filled 2024-02-24: qty 1

## 2024-02-24 MED ORDER — IOHEXOL 350 MG/ML SOLN
75.0000 mL | Freq: Once | INTRAVENOUS | Status: AC | PRN
  Administered 2024-02-24: 75 mL via INTRAVENOUS

## 2024-02-24 NOTE — ED Triage Notes (Addendum)
 Pt in from home via [facility] with upper abdominal pain x 2 days. Reportedly, pt has drank 2 12-packs today, pt endorses vomiting. When asked if any blood was present, pt states "it was red", and last drink 14hrs ago. Sleepy, minimally participating in triage questions VS en route: 160/100 98% 80NSR

## 2024-02-24 NOTE — ED Notes (Signed)
 Patient transported to CT

## 2024-02-24 NOTE — ED Provider Triage Note (Signed)
 Emergency Medicine Provider Triage Evaluation Note  Raymond Ford , a 48 y.o. male  was evaluated in triage.  Pt complains of abdominal pain, nausea, vomiting, alc withdrawal. Reports vomiting red. No previous hx of GI bleed. No drink in around one day.   Review of Systems  Positive: Abd pain, nausea, vomiting Negative:   Physical Exam  BP (!) 191/131 (BP Location: Right Arm)   Pulse 69   Temp 98.3 F (36.8 C) (Oral)   Resp 17   SpO2 98%  Gen:   Awake, no distress   Resp:  Normal effort  MSK:   Moves extremities without difficulty  Other:  Focal ttp in epigastric region, significant guarding  Medical Decision Making  Medically screening exam initiated at 8:34 PM.  Appropriate orders placed.  Raymond Ford was informed that the remainder of the evaluation will be completed by another provider, this initial triage assessment does not replace that evaluation, and the importance of remaining in the ED until their evaluation is complete.  Workup initiated in triage    Raymond Ford 02/24/24 2034

## 2024-02-25 LAB — URINALYSIS, ROUTINE W REFLEX MICROSCOPIC
Bacteria, UA: NONE SEEN
Bilirubin Urine: NEGATIVE
Glucose, UA: NEGATIVE mg/dL
Hgb urine dipstick: NEGATIVE
Ketones, ur: 80 mg/dL — AB
Leukocytes,Ua: NEGATIVE
Nitrite: NEGATIVE
Protein, ur: 30 mg/dL — AB
Specific Gravity, Urine: >1.046 — ABNORMAL HIGH (ref 1.005–1.030)
pH: 5 (ref 5.0–8.0)

## 2024-02-25 MED ORDER — MORPHINE SULFATE (PF) 4 MG/ML IV SOLN
4.0000 mg | Freq: Once | INTRAVENOUS | Status: AC
  Administered 2024-02-25: 4 mg via INTRAVENOUS
  Filled 2024-02-25: qty 1

## 2024-02-25 MED ORDER — SUCRALFATE 1 G PO TABS: 1.0000 g | ORAL_TABLET | Freq: Three times a day (TID) | ORAL | 0 refills | Status: AC

## 2024-02-25 MED ORDER — PANTOPRAZOLE SODIUM 40 MG IV SOLR
40.0000 mg | Freq: Once | INTRAVENOUS | Status: AC
  Administered 2024-02-25: 40 mg via INTRAVENOUS
  Filled 2024-02-25: qty 10

## 2024-02-25 MED ORDER — ONDANSETRON 4 MG PO TBDP: 4.0000 mg | ORAL_TABLET | Freq: Three times a day (TID) | ORAL | 0 refills | Status: AC | PRN

## 2024-02-25 MED ORDER — PROCHLORPERAZINE EDISYLATE 10 MG/2ML IJ SOLN
10.0000 mg | Freq: Once | INTRAMUSCULAR | Status: AC
  Administered 2024-02-25: 10 mg via INTRAVENOUS
  Filled 2024-02-25: qty 2

## 2024-02-25 MED ORDER — SODIUM CHLORIDE 0.9 % IV BOLUS
1000.0000 mL | Freq: Once | INTRAVENOUS | Status: AC
  Administered 2024-02-25: 1000 mL via INTRAVENOUS

## 2024-02-25 MED ORDER — ACETAMINOPHEN 325 MG PO TABS
650.0000 mg | ORAL_TABLET | Freq: Once | ORAL | Status: AC
  Administered 2024-02-25: 650 mg via ORAL
  Filled 2024-02-25: qty 2

## 2024-02-25 MED ORDER — PANTOPRAZOLE SODIUM 40 MG PO TBEC: 40.0000 mg | DELAYED_RELEASE_TABLET | Freq: Every day | ORAL | 0 refills | Status: AC

## 2024-02-25 MED ORDER — SUCRALFATE 1 G PO TABS
1.0000 g | ORAL_TABLET | Freq: Once | ORAL | Status: AC
  Administered 2024-02-25: 1 g via ORAL
  Filled 2024-02-25: qty 1

## 2024-02-25 NOTE — ED Notes (Signed)
Pt given beverage for PO challenge.

## 2024-02-25 NOTE — ED Notes (Signed)
Patient verbalizes understanding of discharge instructions. Opportunity for questioning and answers were provided. Armband removed by staff, pt discharged from ED. Ambulated out to lobby with girlfriend  
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
## 2024-02-25 NOTE — ED Notes (Signed)
 Pt says he is still nauseous after PO challenge, no vomit, pain is getting better.  RN notified

## 2024-03-13 ENCOUNTER — Other Ambulatory Visit: Payer: Self-pay | Admitting: Internal Medicine

## 2024-03-13 DIAGNOSIS — I10 Essential (primary) hypertension: Secondary | ICD-10-CM

## 2024-03-14 NOTE — Telephone Encounter (Signed)
 NOT [facility] PATIENT

## 2024-03-18 ENCOUNTER — Other Ambulatory Visit: Payer: Self-pay

## 2024-03-18 ENCOUNTER — Emergency Department (HOSPITAL_COMMUNITY): Payer: Self-pay

## 2024-03-18 ENCOUNTER — Emergency Department (HOSPITAL_COMMUNITY)
Admission: EM | Admit: 2024-03-18 | Discharge: 2024-03-18 | Discharge status: Against Medical Advice | Payer: Self-pay | Attending: Student | Admitting: Student

## 2024-03-18 DIAGNOSIS — M79652 Pain in left thigh: Secondary | ICD-10-CM | POA: Insufficient documentation

## 2024-03-18 DIAGNOSIS — Z5321 Procedure and treatment not carried out due to patient leaving prior to being seen by health care provider: Secondary | ICD-10-CM | POA: Insufficient documentation

## 2024-03-18 MED ORDER — OXYCODONE-ACETAMINOPHEN 5-325 MG PO TABS
1.0000 | ORAL_TABLET | Freq: Once | ORAL | Status: AC
  Administered 2024-03-18: 1 via ORAL
  Filled 2024-03-18: qty 1

## 2024-03-18 NOTE — ED Notes (Signed)
 Called for vitals, no answer

## 2024-03-18 NOTE — ED Notes (Signed)
 Called for vitals 3x, no answer
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
## 2024-04-20 ENCOUNTER — Emergency Department (HOSPITAL_COMMUNITY): Admission: EM | Admit: 2024-04-20 | Discharge: 2024-04-20 | Discharge status: Against Medical Advice | Payer: Self-pay

## 2024-04-20 NOTE — ED Notes (Signed)
Called name 3x no answer 

## 2024-04-20 NOTE — ED Notes (Signed)
 Pt left without being seen.

## 2024-04-30 ENCOUNTER — Emergency Department (HOSPITAL_COMMUNITY)
Admission: EM | Admit: 2024-04-30 | Discharge: 2024-04-30 | Disposition: A | Discharge status: Home / Self Care | Payer: Self-pay | Attending: Emergency Medicine | Admitting: Emergency Medicine

## 2024-04-30 ENCOUNTER — Emergency Department (HOSPITAL_COMMUNITY): Payer: Self-pay

## 2024-04-30 ENCOUNTER — Encounter (HOSPITAL_COMMUNITY): Payer: Self-pay

## 2024-04-30 ENCOUNTER — Other Ambulatory Visit: Payer: Self-pay

## 2024-04-30 DIAGNOSIS — M79605 Pain in left leg: Secondary | ICD-10-CM | POA: Insufficient documentation

## 2024-04-30 DIAGNOSIS — Z4801 Encounter for change or removal of surgical wound dressing: Secondary | ICD-10-CM | POA: Insufficient documentation

## 2024-04-30 DIAGNOSIS — Z5189 Encounter for other specified aftercare: Secondary | ICD-10-CM

## 2024-04-30 DIAGNOSIS — I10 Essential (primary) hypertension: Secondary | ICD-10-CM | POA: Insufficient documentation

## 2024-04-30 DIAGNOSIS — M79662 Pain in left lower leg: Secondary | ICD-10-CM

## 2024-04-30 DIAGNOSIS — Z79899 Other long term (current) drug therapy: Secondary | ICD-10-CM | POA: Insufficient documentation

## 2024-04-30 LAB — BASIC METABOLIC PANEL WITH GFR
Anion gap: 14 (ref 5–15)
BUN: 8 mg/dL (ref 6–20)
CO2: 22 mmol/L (ref 22–32)
Calcium: 9.3 mg/dL (ref 8.9–10.3)
Chloride: 100 mmol/L (ref 98–111)
Creatinine, Ser: 0.95 mg/dL (ref 0.61–1.24)
GFR, Estimated: >60 mL/min (ref >60)
Glucose, Bld: 70 mg/dL (ref 70–99)
Potassium: 3.9 mmol/L (ref 3.5–5.1)
Sodium: 136 mmol/L (ref 135–145)

## 2024-04-30 LAB — CBC WITH DIFFERENTIAL/PLATELET
Abs Immature Granulocytes: 0.05 10*3/uL (ref 0.00–0.07)
Basophils Absolute: 0 10*3/uL (ref 0.0–0.1)
Basophils Relative: 0 %
Eosinophils Absolute: 0.2 10*3/uL (ref 0.0–0.5)
Eosinophils Relative: 2 %
HCT: 41.3 % (ref 39.0–52.0)
Hemoglobin: 14 g/dL (ref 13.0–17.0)
Immature Granulocytes: 1 %
Lymphocytes Relative: 20 %
Lymphs Abs: 2 10*3/uL (ref 0.7–4.0)
MCH: 32 pg (ref 26.0–34.0)
MCHC: 33.9 g/dL (ref 30.0–36.0)
MCV: 94.5 fL (ref 80.0–100.0)
Monocytes Absolute: 0.6 10*3/uL (ref 0.1–1.0)
Monocytes Relative: 6 %
Neutro Abs: 7 10*3/uL (ref 1.7–7.7)
Neutrophils Relative %: 71 %
Platelets: 350 10*3/uL (ref 150–400)
RBC: 4.37 MIL/uL (ref 4.22–5.81)
RDW: 12.1 % (ref 11.5–15.5)
WBC: 9.8 10*3/uL (ref 4.0–10.5)
nRBC: 0 % (ref 0.0–0.2)

## 2024-04-30 LAB — CK: Total CK: 165 U/L (ref 49–397)

## 2024-04-30 MED ORDER — IOHEXOL 350 MG/ML SOLN
100.0000 mL | Freq: Once | INTRAVENOUS | Status: AC | PRN
  Administered 2024-04-30: 100 mL via INTRAVENOUS

## 2024-04-30 MED ORDER — HYDROMORPHONE HCL 1 MG/ML IJ SOLN
1.0000 mg | Freq: Once | INTRAMUSCULAR | Status: AC
  Administered 2024-04-30: 1 mg via INTRAVENOUS
  Filled 2024-04-30: qty 1

## 2024-04-30 MED ORDER — OXYCODONE HCL 5 MG PO TABS: 5.0000 mg | ORAL_TABLET | Freq: Four times a day (QID) | ORAL | 0 refills | Status: AC | PRN

## 2024-04-30 MED ORDER — OXYCODONE-ACETAMINOPHEN 5-325 MG PO TABS
1.0000 | ORAL_TABLET | Freq: Once | ORAL | Status: AC
  Administered 2024-04-30: 1 via ORAL
  Filled 2024-04-30: qty 1

## 2024-04-30 MED ORDER — KETOROLAC TROMETHAMINE 30 MG/ML IJ SOLN
30.0000 mg | Freq: Once | INTRAMUSCULAR | Status: AC
  Administered 2024-04-30: 30 mg via INTRAVENOUS
  Filled 2024-04-30: qty 1

## 2024-04-30 MED ORDER — DOXYCYCLINE HYCLATE 100 MG PO CAPS: 100.0000 mg | ORAL_CAPSULE | Freq: Two times a day (BID) | ORAL | 0 refills | Status: AC
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
## 2024-04-30 NOTE — ED Notes (Signed)
 Patient transported to CT
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
## 2024-04-30 NOTE — Progress Notes (Signed)
 VASCULAR LAB    Left lower extremity venous duplex has been performed.  See CV proc for preliminary results.  Relayed results to Dr. Gordon Latus via secure chat  Carleene Chase, RVT 04/30/2024, 9:27 AM

## 2024-05-30 ENCOUNTER — Encounter (HOSPITAL_COMMUNITY): Payer: Self-pay | Admitting: *Deleted

## 2024-05-30 ENCOUNTER — Emergency Department (HOSPITAL_COMMUNITY)
Admission: EM | Admit: 2024-05-30 | Discharge: 2024-05-30 | Disposition: A | Discharge status: Home / Self Care | Payer: Self-pay | Attending: Emergency Medicine | Admitting: Emergency Medicine

## 2024-05-30 ENCOUNTER — Other Ambulatory Visit: Payer: Self-pay

## 2024-05-30 DIAGNOSIS — S70352A Superficial foreign body, left thigh, initial encounter: Secondary | ICD-10-CM | POA: Insufficient documentation

## 2024-05-30 DIAGNOSIS — L02416 Cutaneous abscess of left lower limb: Secondary | ICD-10-CM | POA: Insufficient documentation

## 2024-05-30 DIAGNOSIS — S70359A Superficial foreign body, unspecified thigh, initial encounter: Secondary | ICD-10-CM

## 2024-05-30 DIAGNOSIS — W458XXA Other foreign body or object entering through skin, initial encounter: Secondary | ICD-10-CM | POA: Insufficient documentation

## 2024-05-30 MED ORDER — LIDOCAINE-EPINEPHRINE (PF) 2 %-1:200000 IJ SOLN
10.0000 mL | Freq: Once | INTRAMUSCULAR | Status: AC
  Administered 2024-05-30: 10 mL
  Filled 2024-05-30: qty 20

## 2024-05-30 MED ORDER — OXYCODONE-ACETAMINOPHEN 5-325 MG PO TABS
1.0000 | ORAL_TABLET | ORAL | Status: DC | PRN
  Administered 2024-05-30: 1 via ORAL
  Filled 2024-05-30: qty 1

## 2024-05-30 MED ORDER — DOXYCYCLINE HYCLATE 100 MG PO TABS
100.0000 mg | ORAL_TABLET | Freq: Once | ORAL | Status: AC
  Administered 2024-05-30: 100 mg via ORAL

## 2024-05-30 MED ORDER — DOXYCYCLINE HYCLATE 100 MG PO CAPS: 100.0000 mg | ORAL_CAPSULE | Freq: Two times a day (BID) | ORAL | 0 refills | Status: AC
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
## 2024-05-30 NOTE — ED Notes (Signed)
 Pt name called to go back to see a provider, no response
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
## 2024-05-30 NOTE — Discharge Instructions (Signed)
 Take antibiotics and complete the full course. You can apply warm compresses to the area for 20 minutes at a time. Recheck with primary care provider. Return to ER for worsening or concerning symptoms.
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
## 2024-08-14 ENCOUNTER — Emergency Department (HOSPITAL_COMMUNITY): Payer: Self-pay

## 2024-08-14 ENCOUNTER — Encounter (HOSPITAL_COMMUNITY): Payer: Self-pay

## 2024-08-14 ENCOUNTER — Emergency Department (HOSPITAL_COMMUNITY)
Admission: EM | Admit: 2024-08-14 | Discharge: 2024-08-14 | Disposition: A | Discharge status: Home / Self Care | Payer: Self-pay | Attending: Emergency Medicine | Admitting: Emergency Medicine

## 2024-08-14 ENCOUNTER — Other Ambulatory Visit: Payer: Self-pay

## 2024-08-14 DIAGNOSIS — M5030 Other cervical disc degeneration, unspecified cervical region: Secondary | ICD-10-CM | POA: Insufficient documentation

## 2024-08-14 DIAGNOSIS — Z87828 Personal history of other (healed) physical injury and trauma: Secondary | ICD-10-CM | POA: Insufficient documentation

## 2024-08-14 DIAGNOSIS — Z79899 Other long term (current) drug therapy: Secondary | ICD-10-CM | POA: Insufficient documentation

## 2024-08-14 DIAGNOSIS — R030 Elevated blood-pressure reading, without diagnosis of hypertension: Secondary | ICD-10-CM

## 2024-08-14 DIAGNOSIS — M5410 Radiculopathy, site unspecified: Secondary | ICD-10-CM

## 2024-08-14 DIAGNOSIS — M542 Cervicalgia: Secondary | ICD-10-CM

## 2024-08-14 DIAGNOSIS — I10 Essential (primary) hypertension: Secondary | ICD-10-CM | POA: Insufficient documentation

## 2024-08-14 DIAGNOSIS — M5412 Radiculopathy, cervical region: Secondary | ICD-10-CM | POA: Insufficient documentation

## 2024-08-14 LAB — BASIC METABOLIC PANEL WITH GFR
Anion gap: 14 (ref 5–15)
BUN: 6 mg/dL (ref 6–20)
CO2: 20 mmol/L — ABNORMAL LOW (ref 22–32)
Calcium: 9.1 mg/dL (ref 8.9–10.3)
Chloride: 99 mmol/L (ref 98–111)
Creatinine, Ser: 0.89 mg/dL (ref 0.61–1.24)
GFR, Estimated: >60 mL/min (ref >60)
Glucose, Bld: 105 mg/dL — ABNORMAL HIGH (ref 70–99)
Potassium: 3.6 mmol/L (ref 3.5–5.1)
Sodium: 133 mmol/L — ABNORMAL LOW (ref 135–145)

## 2024-08-14 LAB — CBC
HCT: 46.9 % (ref 39.0–52.0)
Hemoglobin: 15.6 g/dL (ref 13.0–17.0)
MCH: 31.1 pg (ref 26.0–34.0)
MCHC: 33.3 g/dL (ref 30.0–36.0)
MCV: 93.6 fL (ref 80.0–100.0)
Platelets: 341 K/uL (ref 150–400)
RBC: 5.01 MIL/uL (ref 4.22–5.81)
RDW: 11.7 % (ref 11.5–15.5)
WBC: 8.9 K/uL (ref 4.0–10.5)
nRBC: 0 % (ref 0.0–0.2)

## 2024-08-14 MED ORDER — METHOCARBAMOL 750 MG PO TABS: 750.0000 mg | ORAL_TABLET | Freq: Three times a day (TID) | ORAL | 0 refills | Status: AC | PRN

## 2024-08-14 MED ORDER — HYDROCHLOROTHIAZIDE 12.5 MG PO TABS
12.5000 mg | ORAL_TABLET | Freq: Once | ORAL | Status: AC
  Administered 2024-08-14: 12.5 mg via ORAL
  Filled 2024-08-14: qty 1

## 2024-08-14 MED ORDER — ACETAMINOPHEN 500 MG PO TABS
1000.0000 mg | ORAL_TABLET | Freq: Once | ORAL | Status: AC
  Administered 2024-08-14: 1000 mg via ORAL
  Filled 2024-08-14: qty 2

## 2024-08-14 MED ORDER — LOSARTAN POTASSIUM 50 MG PO TABS
50.0000 mg | ORAL_TABLET | Freq: Once | ORAL | Status: AC
  Administered 2024-08-14: 50 mg via ORAL
  Filled 2024-08-14: qty 1

## 2024-08-14 MED ORDER — AMLODIPINE BESYLATE 5 MG PO TABS
5.0000 mg | ORAL_TABLET | Freq: Once | ORAL | Status: AC
  Administered 2024-08-14: 5 mg via ORAL
  Filled 2024-08-14: qty 1

## 2024-08-14 MED ORDER — TRAMADOL HCL 50 MG PO TABS
50.0000 mg | ORAL_TABLET | Freq: Once | ORAL | Status: AC
  Administered 2024-08-14: 50 mg via ORAL
  Filled 2024-08-14: qty 1

## 2024-08-14 NOTE — Discharge Instructions (Addendum)
 It was our pleasure to provide your ER care today - we hope that you feel better.  Avoid straining neck/neck muscles. You may try gentle massage and/or heat  therapy to sore area. Take acetaminophen  or ibuprofen  as need for pain. You may also take robaxin  as need for muscle pain/spasm - no driving when taking.   Follow up closely with primary care doctor in the next 1-2 weeks for recheck - discuss possible advanced imaging/MRI if symptoms fail to improve/resolve. Also, your blood pressure is high, make sure to take your meds as prescribed, and follow up closely with primary care doctor in 1-2 weeks.   Return to ER if worse, new symptoms, fevers, severe/intractable pain, numbness/weakness, loss of normal function, or other concern.  You were given pain meds in the ER - no driving for the next 6 hours.
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
## 2024-08-14 NOTE — ED Notes (Signed)
 Patient refused discharge vital signs,

## 2024-08-14 NOTE — ED Notes (Signed)
 Patient ambulated without difficulty; no distress noted
